# Patient Record
Sex: Female | Born: 1935 | Race: White | Hispanic: No | Smoking: Never smoker
Health system: Southern US, Community
[De-identification: ages and names within clinical notes are randomized; demographics above are authoritative.]

## PROBLEM LIST (undated history)

## (undated) DIAGNOSIS — M2550 Pain in unspecified joint: Secondary | ICD-10-CM

## (undated) DIAGNOSIS — C801 Malignant (primary) neoplasm, unspecified: Secondary | ICD-10-CM

## (undated) DIAGNOSIS — E785 Hyperlipidemia, unspecified: Secondary | ICD-10-CM

## (undated) DIAGNOSIS — M797 Fibromyalgia: Secondary | ICD-10-CM

## (undated) DIAGNOSIS — M5136 Other intervertebral disc degeneration, lumbar region: Secondary | ICD-10-CM

## (undated) DIAGNOSIS — Z8709 Personal history of other diseases of the respiratory system: Secondary | ICD-10-CM

## (undated) DIAGNOSIS — R351 Nocturia: Secondary | ICD-10-CM

## (undated) DIAGNOSIS — E669 Obesity, unspecified: Secondary | ICD-10-CM

## (undated) DIAGNOSIS — IMO0002 Reserved for concepts with insufficient information to code with codable children: Secondary | ICD-10-CM

## (undated) DIAGNOSIS — K2970 Gastritis, unspecified, without bleeding: Secondary | ICD-10-CM

## (undated) DIAGNOSIS — M069 Rheumatoid arthritis, unspecified: Secondary | ICD-10-CM

## (undated) DIAGNOSIS — M1990 Unspecified osteoarthritis, unspecified site: Secondary | ICD-10-CM

## (undated) DIAGNOSIS — Z860100 Personal history of colon polyps, unspecified: Secondary | ICD-10-CM

## (undated) DIAGNOSIS — R42 Dizziness and giddiness: Secondary | ICD-10-CM

## (undated) DIAGNOSIS — I10 Essential (primary) hypertension: Secondary | ICD-10-CM

## (undated) DIAGNOSIS — R350 Frequency of micturition: Secondary | ICD-10-CM

## (undated) DIAGNOSIS — R200 Anesthesia of skin: Secondary | ICD-10-CM

## (undated) DIAGNOSIS — M810 Age-related osteoporosis without current pathological fracture: Secondary | ICD-10-CM

## (undated) DIAGNOSIS — M549 Dorsalgia, unspecified: Secondary | ICD-10-CM

## (undated) DIAGNOSIS — I639 Cerebral infarction, unspecified: Secondary | ICD-10-CM

## (undated) DIAGNOSIS — K649 Unspecified hemorrhoids: Secondary | ICD-10-CM

## (undated) DIAGNOSIS — J189 Pneumonia, unspecified organism: Secondary | ICD-10-CM

## (undated) DIAGNOSIS — K219 Gastro-esophageal reflux disease without esophagitis: Secondary | ICD-10-CM

## (undated) DIAGNOSIS — I219 Acute myocardial infarction, unspecified: Secondary | ICD-10-CM

## (undated) DIAGNOSIS — K5790 Diverticulosis of intestine, part unspecified, without perforation or abscess without bleeding: Secondary | ICD-10-CM

## (undated) DIAGNOSIS — M51369 Other intervertebral disc degeneration, lumbar region without mention of lumbar back pain or lower extremity pain: Secondary | ICD-10-CM

## (undated) DIAGNOSIS — E119 Type 2 diabetes mellitus without complications: Secondary | ICD-10-CM

## (undated) HISTORY — PX: TUBAL LIGATION: SHX77

## (undated) HISTORY — DX: Malignant (primary) neoplasm, unspecified: C80.1

## (undated) HISTORY — DX: Type 2 diabetes mellitus without complications: E11.9

## (undated) HISTORY — DX: Hyperlipidemia, unspecified: E78.5

## (undated) HISTORY — DX: Acute myocardial infarction, unspecified: I21.9

## (undated) HISTORY — PX: APPENDECTOMY: SHX54

## (undated) HISTORY — DX: Essential (primary) hypertension: I10

## (undated) HISTORY — DX: Reserved for concepts with insufficient information to code with codable children: IMO0002

## (undated) HISTORY — DX: Unspecified osteoarthritis, unspecified site: M19.90

## (undated) HISTORY — DX: Other intervertebral disc degeneration, lumbar region without mention of lumbar back pain or lower extremity pain: M51.369

## (undated) HISTORY — DX: Obesity, unspecified: E66.9

## (undated) HISTORY — DX: Cerebral infarction, unspecified: I63.9

## (undated) HISTORY — PX: COLONOSCOPY: SHX174

## (undated) HISTORY — DX: Other intervertebral disc degeneration, lumbar region: M51.36

## (undated) HISTORY — DX: Rheumatoid arthritis, unspecified: M06.9

## (undated) HISTORY — DX: Gastro-esophageal reflux disease without esophagitis: K21.9

## (undated) HISTORY — DX: Fibromyalgia: M79.7

---

## 1999-02-04 ENCOUNTER — Ambulatory Visit (HOSPITAL_COMMUNITY): Admission: RE | Admit: 1999-02-04 | Discharge: 1999-02-04 | Payer: Self-pay

## 2003-07-02 ENCOUNTER — Encounter: Payer: Self-pay | Admitting: Family Medicine

## 2003-07-02 ENCOUNTER — Encounter: Admission: RE | Admit: 2003-07-02 | Discharge: 2003-07-02 | Payer: Self-pay | Admitting: Family Medicine

## 2004-07-18 ENCOUNTER — Encounter: Admission: RE | Admit: 2004-07-18 | Discharge: 2004-07-18 | Payer: Self-pay | Admitting: Family Medicine

## 2005-11-05 ENCOUNTER — Encounter: Admission: RE | Admit: 2005-11-05 | Discharge: 2005-11-05 | Payer: Self-pay | Admitting: Family Medicine

## 2007-10-01 ENCOUNTER — Encounter: Admission: RE | Admit: 2007-10-01 | Discharge: 2007-10-01 | Payer: Self-pay

## 2007-10-05 ENCOUNTER — Emergency Department (HOSPITAL_COMMUNITY): Admission: EM | Admit: 2007-10-05 | Discharge: 2007-10-05 | Payer: Self-pay | Admitting: Emergency Medicine

## 2007-11-04 ENCOUNTER — Encounter (INDEPENDENT_AMBULATORY_CARE_PROVIDER_SITE_OTHER): Payer: Self-pay | Admitting: Family Medicine

## 2007-11-04 ENCOUNTER — Ambulatory Visit: Payer: Self-pay | Admitting: *Deleted

## 2007-11-04 ENCOUNTER — Ambulatory Visit (HOSPITAL_COMMUNITY): Admission: RE | Admit: 2007-11-04 | Discharge: 2007-11-04 | Payer: Self-pay | Admitting: Family Medicine

## 2007-12-01 LAB — HM MAMMOGRAPHY

## 2008-08-04 ENCOUNTER — Encounter: Admission: RE | Admit: 2008-08-04 | Discharge: 2008-08-04 | Payer: Self-pay

## 2008-09-12 ENCOUNTER — Encounter: Admission: RE | Admit: 2008-09-12 | Discharge: 2008-10-08 | Payer: Self-pay | Admitting: Orthopedic Surgery

## 2008-09-14 ENCOUNTER — Other Ambulatory Visit: Admission: RE | Admit: 2008-09-14 | Discharge: 2008-09-14 | Payer: Self-pay | Admitting: Family Medicine

## 2010-09-26 ENCOUNTER — Encounter: Payer: Self-pay | Admitting: Family Medicine

## 2010-12-09 ENCOUNTER — Ambulatory Visit
Admission: RE | Admit: 2010-12-09 | Discharge: 2010-12-09 | Payer: Self-pay | Source: Home / Self Care | Attending: Family Medicine | Admitting: Family Medicine

## 2010-12-09 DIAGNOSIS — Z860100 Personal history of colon polyps, unspecified: Secondary | ICD-10-CM | POA: Insufficient documentation

## 2010-12-09 DIAGNOSIS — IMO0002 Reserved for concepts with insufficient information to code with codable children: Secondary | ICD-10-CM | POA: Insufficient documentation

## 2010-12-09 DIAGNOSIS — K5730 Diverticulosis of large intestine without perforation or abscess without bleeding: Secondary | ICD-10-CM | POA: Insufficient documentation

## 2010-12-09 DIAGNOSIS — J42 Unspecified chronic bronchitis: Secondary | ICD-10-CM | POA: Insufficient documentation

## 2010-12-09 DIAGNOSIS — E663 Overweight: Secondary | ICD-10-CM | POA: Insufficient documentation

## 2010-12-09 DIAGNOSIS — K219 Gastro-esophageal reflux disease without esophagitis: Secondary | ICD-10-CM | POA: Insufficient documentation

## 2010-12-09 DIAGNOSIS — H00019 Hordeolum externum unspecified eye, unspecified eyelid: Secondary | ICD-10-CM | POA: Insufficient documentation

## 2010-12-09 DIAGNOSIS — Z9189 Other specified personal risk factors, not elsewhere classified: Secondary | ICD-10-CM | POA: Insufficient documentation

## 2010-12-09 DIAGNOSIS — E782 Mixed hyperlipidemia: Secondary | ICD-10-CM | POA: Insufficient documentation

## 2010-12-09 DIAGNOSIS — Z8619 Personal history of other infectious and parasitic diseases: Secondary | ICD-10-CM | POA: Insufficient documentation

## 2010-12-09 DIAGNOSIS — M1990 Unspecified osteoarthritis, unspecified site: Secondary | ICD-10-CM | POA: Insufficient documentation

## 2010-12-09 DIAGNOSIS — M069 Rheumatoid arthritis, unspecified: Secondary | ICD-10-CM

## 2010-12-09 DIAGNOSIS — Z8719 Personal history of other diseases of the digestive system: Secondary | ICD-10-CM | POA: Insufficient documentation

## 2010-12-09 DIAGNOSIS — H259 Unspecified age-related cataract: Secondary | ICD-10-CM | POA: Insufficient documentation

## 2010-12-09 HISTORY — DX: Rheumatoid arthritis, unspecified: M06.9

## 2010-12-09 HISTORY — DX: Unspecified osteoarthritis, unspecified site: M19.90

## 2010-12-09 HISTORY — DX: Reserved for concepts with insufficient information to code with codable children: IMO0002
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
## 2011-01-02 ENCOUNTER — Encounter: Payer: Self-pay | Admitting: Family Medicine

## 2011-01-21 NOTE — Letter (Signed)
Summary: 2006-2011  2006-2011   Imported By: Lester [place] 01/13/2011 07:20:18  _____________________________________________________________________  External Attachment:    Type:   Image     Comment:   External Document

## 2011-02-09 ENCOUNTER — Encounter: Payer: Self-pay | Admitting: Family Medicine

## 2011-02-16 ENCOUNTER — Encounter: Payer: Self-pay | Admitting: Family Medicine

## 2011-02-18 ENCOUNTER — Encounter: Payer: Self-pay | Admitting: Family Medicine

## 2011-02-18 ENCOUNTER — Ambulatory Visit (INDEPENDENT_AMBULATORY_CARE_PROVIDER_SITE_OTHER): Payer: Medicare Other | Admitting: Family Medicine

## 2011-02-18 DIAGNOSIS — M069 Rheumatoid arthritis, unspecified: Secondary | ICD-10-CM

## 2011-02-18 DIAGNOSIS — K219 Gastro-esophageal reflux disease without esophagitis: Secondary | ICD-10-CM

## 2011-02-18 DIAGNOSIS — E782 Mixed hyperlipidemia: Secondary | ICD-10-CM

## 2011-02-18 DIAGNOSIS — E663 Overweight: Secondary | ICD-10-CM

## 2011-02-18 NOTE — Progress Notes (Signed)
  Subjective:    Patient ID: [name], female    DOB: 1935/03/26, 76 y.o.   MRN: [id number]  HPI Patient is a 76 yo Caucasian female in today for follow up on her new patient appt. She has recently been seen by her Rheumatologist and had injections in b/l shoulders for chronic pain and she has had good results. Her shoulders and back pain are tolerable at this time. Her biggest concern today is related to chronic dyspepsia and epigastric discomfort. She reports this is long standing but has recently worsened. She has had a colonoscopy in the past but never an upper endoscopy. She denies any H Pylori testing that she is aware of. No fevers/chills/constipation/diarrhea/bloody or tarry stool. She does experience some nausea/epigastric and periumbilical discomfort. Some substernal burning and some sour taste in the mouth is noted as well at times. She acknowledges that if she eats a better diet with less red meat, fatty or acidic and spicy foods she feels better. She has cut her consumption of coffee to just one cup per day and that has helped some. No CP/palp/SOB/GU c/o. She does note that raw veg/salad and red meat all tend to make her symptoms worse as well. The epigastric pain is somewhat worse when she lies down so she has been sleeping in her recliner to decrease her symptoms    Review of Systems  Constitutional: Negative for fever, chills, activity change, appetite change, fatigue and unexpected weight change.  HENT: Positive for sore throat. Negative for congestion, drooling, mouth sores, trouble swallowing, neck pain and postnasal drip.   Respiratory: Negative for apnea, cough, choking, chest tightness, shortness of breath and wheezing.   Cardiovascular: Negative for chest pain and palpitations.  Gastrointestinal: Negative for nausea, vomiting, abdominal pain, diarrhea, constipation and abdominal distention.  Musculoskeletal: Positive for back pain and arthralgias.  Psychiatric/Behavioral:  Negative for behavioral problems and agitation.       Objective:   Physical Exam  Constitutional: She is oriented to person, place, and time. She appears well-developed and well-nourished. No distress.  HENT:  Head: Normocephalic and atraumatic.  Eyes: Conjunctivae are normal.  Neck: Normal range of motion. Neck supple. No thyromegaly present.  Cardiovascular: Normal rate, regular rhythm and normal heart sounds.   No murmur heard. Pulmonary/Chest: Effort normal and breath sounds normal.  Abdominal: Soft. Bowel sounds are normal. She exhibits no mass. There is no tenderness. There is no rebound and no guarding.  Musculoskeletal: She exhibits no edema.  Lymphadenopathy:    She has no cervical adenopathy.  Neurological: She is alert and oriented to person, place, and time.  Skin: No rash noted. She is not diaphoretic.  Psychiatric: She has a normal mood and affect.          Assessment & Plan:

## 2011-02-18 NOTE — Assessment & Plan Note (Signed)
Continue ongoing dietary restrictions and exercise as tolerated. Even slight weight loss should help reflux.

## 2011-02-18 NOTE — Assessment & Plan Note (Signed)
Is following with Rheumatology and is actually doing relatively well in that regard, no change in therapy at this time but encouraged increased physical activity

## 2011-02-18 NOTE — Assessment & Plan Note (Signed)
Patient is struggling with worsening symptoms. She agrees to try Tums prn, limit diet and add Ginger caps tid, then she will return next week to have an H Pylori titer drawn next week and she will report worsening symptoms. Patient reports adverse reactions to various acid suppressing meds in the past but she cannot remember which ones so she is hesitant to start one at this time. She has never had a upper endoscopy and is hesitant to proceed with one due to bad outcomes she has heard about. She will consider referral to Dr Jason Fila her Gastroenterologist if she is not improving for possible endoscopy

## 2011-02-18 NOTE — Patient Instructions (Addendum)
Heartburn  Heartburn is a painful, burning sensation in the chest. It may feel worse in certain positions, such as lying down or bending over. It is caused by stomach acid backing up into the tube that carries food from the mouth down to the stomach (lower esophagus).  CAUSES A number of conditions can cause or worsen heartburn, including:   Pregnancy.   Being overweight (obesity).   A condition called hiatal hernia, in which part or all of the stomach is moved up into the chest through a weakness in the diaphragm muscle.   Alcohol.   Exercise.   Eating just before going to bed.   Overeating.   Medications, including:   Nonsteroidal anti-inflammatory drugs, such as ibuprofen and naproxen.   Aspirin.   Some blood pressure medicines, including beta-blockers, calcium channel blockers, and alpha-blockers.   Nitrates (used to treat angina).   The asthma medication Theophylline.   Certain sedative drugs.   Heartburn may be worse after eating certain foods. These heartburn-causing foods are different for different people, but may include:   Peppers.   Chocolate.   Coffee.   High-fat foods, including fried foods.   Spicy foods.   Garlic, onions.   Citrus fruits, including oranges, grapefruit, lemons and limes.   Food containing tomatoes or tomato products.   Mint.   Carbonated beverages.   Vinegar.  SYMPTOMS  Symptoms may last for a few minutes or a few hours, and can include:  Burning pain in the chest or lower throat.   Bitter taste in the mouth.   Coughing.  DIAGNOSIS If the usual treatments for heartburn do not improve your symptoms, then tests may be done to see if there is another condition present. Possible tests may include:  X-rays.   Endoscopy. This is when a tube with a light and a camera on the end is used to examine the esophagus and the stomach.   Blood, breath, or stool tests may be used to check for bacteria that cause ulcers.    TREATMENT There are a number of non-prescription medicines used to treat heartburn, including:  Antacids.   Acid reducers (also called H-2 blockers).   Proton-pump inhibitors.  HOME CARE INSTRUCTIONS  Raise the head of your bed by putting blocks under the legs.   Eat 2-3 hours before going to bed.   Stop smoking.   Try to reach and maintain a healthy weight.   Do not eat just a few very large meals. Instead, eat many smaller meals throughout the day.   Try to identify foods and beverages that make your symptoms worse, and avoid these.   Avoid tight clothing.   Do not exercise right after eating.  SEEK IMMEDIATE MEDICAL CARE IF YOU:  Have severe chest pain that goes down your arm, or into your jaw or neck.   Feel sweaty, dizzy, or lightheaded.   Are short of breath.   Throw up (vomit) blood.   Have difficulty or pain with swallowing.   Have bloody or black, tarry stools.   Have bouts of heartburn more than three times a week for more than two weeks.  Document Released: 04/04/2009 Document Re-Released: 02/10/2010 [facility] Patient Information 2011 [place], [place].  Try Ginger tea or Ginger caps three x daily with meals and at bedtime, avoid offending foods and continue avoiding offending foods Consider the DASH DIET  Needs labs in next 1-2 weeks, fasting, FLP, liver, renal, CBC, TSH, H Pylori serum

## 2011-02-18 NOTE — Assessment & Plan Note (Signed)
Patient has significantly changed her diet and is avoiding partially hydrogenated oils and saturated fats as much as possible. We will repeat a FLP next week to further assess. She reports bad reaction with myalgias to a statin in the past but she is unsure which one. She will try and identify the agent with her pharmacist.

## 2011-02-24 ENCOUNTER — Ambulatory Visit: Payer: Self-pay | Admitting: Family Medicine

## 2011-02-26 ENCOUNTER — Other Ambulatory Visit (INDEPENDENT_AMBULATORY_CARE_PROVIDER_SITE_OTHER): Payer: Medicare Other | Admitting: Family Medicine

## 2011-02-26 DIAGNOSIS — R1013 Epigastric pain: Secondary | ICD-10-CM

## 2011-02-26 DIAGNOSIS — E782 Mixed hyperlipidemia: Secondary | ICD-10-CM

## 2011-02-26 DIAGNOSIS — E663 Overweight: Secondary | ICD-10-CM

## 2011-02-26 DIAGNOSIS — E785 Hyperlipidemia, unspecified: Secondary | ICD-10-CM

## 2011-02-26 LAB — RENAL FUNCTION PANEL
Calcium: 9.3 mg/dL (ref 8.4–10.5)
Glucose, Bld: 83 mg/dL (ref 70–99)
Phosphorus: 3.4 mg/dL (ref 2.3–4.6)
Potassium: 4.4 mEq/L (ref 3.5–5.1)
Sodium: 143 mEq/L (ref 135–145)

## 2011-02-26 LAB — CBC WITH DIFFERENTIAL/PLATELET
Eosinophils Absolute: 0.1 10*3/uL (ref 0.0–0.7)
Eosinophils Relative: 3.5 % (ref 0.0–5.0)
MCV: 97.6 fl (ref 78.0–100.0)
Monocytes Absolute: 0.4 10*3/uL (ref 0.1–1.0)
Neutrophils Relative %: 38.4 % — ABNORMAL LOW (ref 43.0–77.0)
Platelets: 206 10*3/uL (ref 150.0–400.0)
WBC: 3.8 10*3/uL — ABNORMAL LOW (ref 4.5–10.5)

## 2011-02-26 LAB — H. PYLORI ANTIBODY, IGG: H Pylori IgG: NEGATIVE

## 2011-02-26 LAB — LIPID PANEL
HDL: 58.1 mg/dL (ref >39.00)
Total CHOL/HDL Ratio: 4
VLDL: 20.4 mg/dL (ref 0.0–40.0)

## 2011-02-26 LAB — HEPATIC FUNCTION PANEL
AST: 30 U/L (ref 0–37)
Alkaline Phosphatase: 38 U/L — ABNORMAL LOW (ref 39–117)
Bilirubin, Direct: 0.2 mg/dL (ref 0.0–0.3)
Total Bilirubin: 0.6 mg/dL (ref 0.3–1.2)

## 2011-02-26 LAB — TSH: TSH: 2.02 u[IU]/mL (ref 0.35–5.50)

## 2011-02-26 LAB — LDL CHOLESTEROL, DIRECT: Direct LDL: 173.8 mg/dL

## 2011-02-26 NOTE — Progress Notes (Deleted)
[name] 02/26/2011      Progress Note-Follow Up  Subjective Subjective   Past Medical History  Diagnosis Date  . Hyperlipidemia   . Overweight 12/09/2010  . CATARACT, SENILE, BILATERAL 12/09/2010  . BRONCHITIS, RECURRENT 12/09/2010  . GERD 12/09/2010  . DIVERTICULAR DISEASE 12/09/2010  . Rheumatoid arthritis 12/09/2010  . LOC OSTEOARTHROS NOT SPEC PRIM/SEC UNSPEC SITE 12/09/2010  . DEGENERATIVE DISC DISEASE, AXIAL SKELETON 12/09/2010  . MEASLES, HX OF 12/09/2010  . COLONIC POLYPS, HX OF 12/09/2010    Past Surgical History  Procedure Date  . Appendectomy 1953  . Cesarean section 1965    RH neg  . Polypectomy 2009    Family History  Problem Relation Age of Onset  . Alcohol abuse Mother   . Asthma Mother   . Obesity Mother   . Aneurysm Father     abdominal aortic  . Heart attack Sister   . Obesity Sister   . Hypertension Sister   . Cancer Brother     abdominal, bone  . Colon polyps Daughter   . Heart disease Paternal Grandfather   . Cancer Sister     skin, recurrent breast cancer s/p mastectomy, declined other  treatment  . Obesity Sister   . Hypertension Sister   . Stroke Brother     hemorrhagic  . Hypertension Brother   . Diabetes Brother   . Arthritis Brother   . Hypertension Brother   . Arthritis Daughter   . Hyperlipidemia Daughter   . Obesity Daughter   . Colon polyps Daughter     precancerous  . Endometriosis Daughter     History   Social History  . Marital Status: Widowed    Spouse Name: N/A    Number of Children: N/A  . Years of Education: N/A   Occupational History  . Not on file.   Social History Main Topics  . Smoking status: Never Smoker   . Smokeless tobacco: Never Used  . Alcohol Use: No  . Drug Use: No  . Sexually Active: No   Other Topics Concern  . Not on file   Social History Narrative  . No narrative on file    Current Outpatient Prescriptions on File Prior to Visit  Medication Sig Dispense Refill  . ascorbic acid  (VITAMIN C) 500 MG tablet Take 500 mg by mouth 3 (three) times daily. Vitamin E       . Calcium Carbonate (CALCIUM 600 PO) Take by mouth daily.        . Calcium-Magnesium 350-233.33 MG TABS Take by mouth 3 (three) times daily.        . Cholecalciferol (VITAMIN D3) 1000 UNITS capsule Take 1,000 Units by mouth 2 (two) times daily. Take 2       . Coenzyme Q10 (COQ10) 100 MG capsule Take 100 mg by mouth daily.        . Digestive Enzymes (MULTI-ENZYME PO) Take by mouth 2 (two) times daily.        . fish oil-omega-3 fatty acids 1000 MG capsule Take 2 g by mouth 3 (three) times daily.        . Multiple Vitamin (MULTIVITAMIN) tablet Take 1 tablet by mouth daily.        . NON FORMULARY 900 mg 2 (two) times daily. Colest take 2 bid       . tobramycin (TOBREX) 0.3 % ophthalmic solution Place 2 drops into both eyes 4 (four) times daily.  Allergies  Allergen Reactions  . Codeine   . Esomeprazole Magnesium    ROS   Objective Objective  There were no vitals taken for this visit.  Physical Exam  Constitutional: She is well-developed, well-nourished, and in no distress. No distress.  HENT:  Left Ear: External ear normal.  Mouth/Throat: No oropharyngeal exudate.  Eyes: EOM are normal. Left eye exhibits no discharge. No scleral icterus.  Neck: No JVD present. Tracheal deviation present.  Cardiovascular: Normal heart sounds and intact distal pulses.   Pulmonary/Chest: She is in respiratory distress. She has no rales.  Abdominal: She exhibits no distension and no mass. There is tenderness. There is guarding.  Musculoskeletal: She exhibits edema. She exhibits no tenderness.  Lymphadenopathy:    She has no cervical adenopathy.  Skin: No rash noted. No erythema.     No results found for this basename: TSH   No results found for this basename: WBC, HGB, HCT, MCV, PLT   No results found for this basename: CREATININE, BUN, NA, K, CL, CO2   No results found for this basename: ALT, AST,  GGT, ALKPHOS, BILITOT   No results found for this basename: CHOL   No results found for this basename: HDL   No results found for this basename: LDLCALC   No results found for this basename: TRIG   No results found for this basename: CHOLHDL     Assessment & Plan  No problem-specific assessment & plan notes found for this encounter.

## 2011-02-26 NOTE — Progress Notes (Deleted)
  Subjective:    Patient ID: [name], female    DOB: 07-19-1935, 76 y.o.   MRN: [id number]  HPI    Review of Systems  Constitutional: Negative for fever and appetite change.  HENT: Negative for congestion and neck stiffness.   Eyes: Negative for visual disturbance.  Respiratory: Negative for shortness of breath.   Cardiovascular: Negative for chest pain, palpitations and leg swelling.  Gastrointestinal: Negative for nausea, vomiting, abdominal pain, diarrhea, constipation and abdominal distention.  Genitourinary: Negative for dysuria, urgency and frequency.  Musculoskeletal: Negative for myalgias and back pain.  Skin: Negative for rash.  Neurological: Negative for syncope and headaches.  Psychiatric/Behavioral: Negative for suicidal ideas, behavioral problems, self-injury, decreased concentration and agitation.       Objective:   Physical Exam  Constitutional: She is oriented to person, place, and time. She appears well-developed and well-nourished. No distress.  HENT:  Head: Normocephalic and atraumatic.  Eyes: Conjunctivae are normal.  Neck: Neck supple. No thyromegaly present.  Cardiovascular: Normal rate, regular rhythm and normal heart sounds.   No murmur heard. Pulmonary/Chest: Effort normal and breath sounds normal. No respiratory distress.  Abdominal: Soft. Bowel sounds are normal. She exhibits no distension and no mass. There is no tenderness.  Musculoskeletal: She exhibits no edema.  Lymphadenopathy:    She has no cervical adenopathy.  Neurological: She is alert and oriented to person, place, and time.  Skin: Skin is warm and dry.  Psychiatric: She has a normal mood and affect. Her behavior is normal.          Assessment & Plan:

## 2011-02-26 NOTE — Progress Notes (Deleted)
  Subjective:    Patient ID: [name], female    DOB: 1935-08-10, 76 y.o.   MRN: [id number]  HPI    Review of Systems  All other systems reviewed and are negative.       Objective:   Physical Exam  Constitutional: She is oriented to person, place, and time. She appears well-developed and well-nourished. No distress.  HENT:  Head: Normocephalic and atraumatic.  Eyes: Conjunctivae are normal.  Neck: Neck supple. No thyromegaly present.  Cardiovascular: Normal rate, regular rhythm and normal heart sounds.   No murmur heard. Pulmonary/Chest: Effort normal and breath sounds normal. No respiratory distress.  Abdominal: Soft. Bowel sounds are normal. She exhibits no distension and no mass. There is no tenderness.  Musculoskeletal: She exhibits no edema.  Lymphadenopathy:    She has no cervical adenopathy.  Neurological: She is alert and oriented to person, place, and time.  Skin: Skin is warm and dry.  Psychiatric: She has a normal mood and affect. Her behavior is normal.          Assessment & Plan:

## 2011-03-05 ENCOUNTER — Telehealth: Payer: Self-pay | Admitting: Family Medicine

## 2011-03-05 NOTE — Telephone Encounter (Signed)
Mailed to pt per pt request.

## 2011-03-05 NOTE — Telephone Encounter (Signed)
Patient is requesting a printed copy of her lab result, call patient when ready

## 2011-05-22 ENCOUNTER — Ambulatory Visit (INDEPENDENT_AMBULATORY_CARE_PROVIDER_SITE_OTHER): Payer: Medicare Other | Admitting: Family Medicine

## 2011-05-22 ENCOUNTER — Encounter: Payer: Self-pay | Admitting: Family Medicine

## 2011-05-22 DIAGNOSIS — Z889 Allergy status to unspecified drugs, medicaments and biological substances status: Secondary | ICD-10-CM | POA: Insufficient documentation

## 2011-05-22 DIAGNOSIS — J40 Bronchitis, not specified as acute or chronic: Secondary | ICD-10-CM

## 2011-05-22 DIAGNOSIS — J42 Unspecified chronic bronchitis: Secondary | ICD-10-CM

## 2011-05-22 DIAGNOSIS — K219 Gastro-esophageal reflux disease without esophagitis: Secondary | ICD-10-CM

## 2011-05-22 DIAGNOSIS — Z9109 Other allergy status, other than to drugs and biological substances: Secondary | ICD-10-CM

## 2011-05-22 MED ORDER — ALBUTEROL SULFATE HFA 108 (90 BASE) MCG/ACT IN AERS: 2.0000 | INHALATION_SPRAY | Freq: Four times a day (QID) | RESPIRATORY_TRACT | Status: DC | PRN

## 2011-05-22 MED ORDER — LORATADINE 10 MG PO TABS: 10.0000 mg | ORAL_TABLET | Freq: Every day | ORAL | Status: DC

## 2011-05-22 MED ORDER — SALINE 0.65 % NA SOLN: 2.0000 | Freq: Two times a day (BID) | NASAL | Status: DC | PRN

## 2011-05-22 MED ORDER — AZITHROMYCIN 250 MG PO TABS: ORAL_TABLET | ORAL | Status: AC

## 2011-05-22 NOTE — Assessment & Plan Note (Signed)
Patient has been struggling with increased shortness of breath and cough for about 2 weeks. 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 She does note over the years she's had worsening difficulty with shortness of breath cough. Today we'll treat an acute bronchitis with azithromycin. We'll also give her albuterol to use with instructions when necessary for shortness of breath, wheezing or cough. For the allergic component we will have her start Claritin and nasal saline daily and reassess when she returns from trip to visit family in [place]. She agrees to have a chest x-ray done before she leaves and to notify us if symptoms worsen. Upon returning she may benefit from further workup if her symptoms are not improving.

## 2011-05-22 NOTE — Assessment & Plan Note (Signed)
Patient notes this is significantly better with dietary changes and probiotics. At this point is very little heartburn no abdominal pain

## 2011-05-22 NOTE — Patient Instructions (Signed)
Bronchitis Bronchitis is the body's way of reacting to injury and/or infection (inflammation) of the bronchi. Bronchi are the air tubes that extend from the windpipe into the lungs. If the inflammation becomes severe, it may cause shortness of breath.  CAUSES Inflammation may be caused by:  A virus.   Germs (bacteria).   Dust.   Allergens.   Pollutants and many other irritants.  The cells lining the bronchial tree are covered with tiny hairs (cilia). These constantly beat upward, away from the lungs, toward the mouth. This keeps the lungs free of pollutants. When these cells become too irritated and are unable to do their job, mucus begins to develop. This causes the characteristic cough of bronchitis. The cough clears the lungs when the cilia are unable to do their job. Without either of these protective mechanisms, the mucus would settle in the lungs. Then you would develop pneumonia. Smoking is a common cause of bronchitis and can contribute to pneumonia. Stopping this habit is the single most important thing you can do to help yourself. TREATMENT  Your caregiver may prescribe an antibiotic if the cough is caused by bacteria. Also, medicines that open up your airways make it easier to breathe. Your caregiver may also recommend or prescribe an expectorant. It will loosen the mucus to be coughed up. Only take over-the-counter or prescription medicines for pain, discomfort, or fever as directed by your caregiver.   Removing whatever causes the problem (smoking, for example) is critical to preventing the problem from getting worse.   Cough suppressants may be prescribed for relief of cough symptoms.   Inhaled medicines may be prescribed to help with symptoms now and to help prevent problems from returning.   For those with recurrent (chronic) bronchitis, there may be a need for steroid medicines.  SEEK IMMEDIATE MEDICAL CARE IF:  During treatment, you develop more pus-like mucus  (purulent sputum).   You or your child has an oral temperature above 104, not controlled by medicine.   Your baby is older than 3 months with a rectal temperature of 102 F (38.9 C) or higher.   Your baby is 14 months old or younger with a rectal temperature of 100.4 F (38 C) or higher.   You become progressively more ill.   You have increased difficulty breathing, wheezing, or shortness of breath.  It is necessary to seek immediate medical care if you are elderly or sick from any other disease. MAKE SURE YOU:  Understand these instructions.   Will watch your condition.   Will get help right away if you are not doing well or get worse.  Document Released: 11/16/2005 Document Re-Released: 02/10/2010 [facility] Patient Information 2011 [place], [place].

## 2011-05-22 NOTE — Progress Notes (Signed)
[name] [id number] Aug 16, 1935 05/22/2011      Progress Note-Follow Up  Subjective  Chief Complaint  Chief Complaint  Patient presents with  . Shortness of Breath    and feels like she's off balance when going outside    HPI  Patient is a 75 year old Caucasian female who is in today with 2 weeks worth of worsening shortness of breath. She reports she takes a walk each day and has a small incline on the initial part of the walk. It always makes her somewhat short of breath which she recovers quickly and is able to complete her walk. Over the last 1-2 weeks the symptoms are worsening. She becomes short of breath faster and it sticks around longer she is having some coughing associated with it, some tightness in her throat. She denies any wheezing, fevers, chills, chest pain, palpitations, ear pain or sensation of presyncope but she does note occasionally feeling somewhat lightheaded and having clear rhinorrhea at times. She relates a long history of allergies dating back to childhood with watery itchy eyes and nose at times. She has an ongoing difficulty with being unable to breathe out of the left side of her nose even since childhood that is not worsening. 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 She is concerned that this has affected her breathing. She has not tried any over-the-counter medications for her symptoms  Past Medical History  Diagnosis Date  . Hyperlipidemia   . Overweight 12/09/2010  . CATARACT, SENILE, BILATERAL 12/09/2010  . BRONCHITIS, RECURRENT 12/09/2010  . GERD 12/09/2010  . DIVERTICULAR DISEASE 12/09/2010  . Rheumatoid arthritis 12/09/2010  . LOC OSTEOARTHROS NOT SPEC PRIM/SEC UNSPEC SITE 12/09/2010  . DEGENERATIVE DISC DISEASE, AXIAL SKELETON 12/09/2010  . MEASLES, HX OF 12/09/2010  . COLONIC POLYPS, HX OF 12/09/2010  . Multiple allergies 05/22/2011    Past Surgical History    Procedure Date  . Appendectomy 1953  . Cesarean section 1965    RH neg  . Polypectomy 2009    Family History  Problem Relation Age of Onset  . Alcohol abuse Mother   . Asthma Mother   . Obesity Mother   . Aneurysm Father     abdominal aortic  . Heart attack Sister   . Obesity Sister   . Hypertension Sister   . Cancer Brother     abdominal, bone  . Colon polyps Daughter   . Heart disease Paternal Grandfather   . Cancer Sister     skin, recurrent breast cancer s/p mastectomy, declined other  treatment  . Obesity Sister   . Hypertension Sister   . Stroke Brother     hemorrhagic  . Hypertension Brother   . Diabetes Brother   . Arthritis Brother   . Hypertension Brother   . Arthritis Daughter   . Hyperlipidemia Daughter   . Obesity Daughter   . Colon polyps Daughter     precancerous  . Endometriosis Daughter     History   Social History  . Marital Status: Widowed    Spouse Name: N/A    Number of Children: N/A  . Years of Education: N/A   Occupational History  . Not on file.   Social History Main Topics  . Smoking status: Never Smoker   . Smokeless tobacco: Never Used  . Alcohol Use: No  . Drug Use: No  . Sexually Active: No  Other Topics Concern  . Not on file   Social History Narrative  . No narrative on file    Current Outpatient Prescriptions on File Prior to Visit  Medication Sig Dispense Refill  . Calcium Carbonate (CALCIUM 600 PO) Take by mouth daily.        . Coenzyme Q10 (COQ10) 100 MG capsule Take 100 mg by mouth daily.        . Digestive Enzymes (MULTI-ENZYME PO) Take by mouth 2 (two) times daily.        . fish oil-omega-3 fatty acids 1000 MG capsule Take 2 g by mouth 3 (three) times daily.        . NON FORMULARY 900 mg 2 (two) times daily. Colest take 2 bid       . tobramycin (TOBREX) 0.3 % ophthalmic solution Place 2 drops into both eyes 4 (four) times daily.        Marland Kitchen ascorbic acid (VITAMIN C) 500 MG tablet Take 500 mg by mouth 3  (three) times daily. Vitamin E       . Calcium-Magnesium 350-233.33 MG TABS Take by mouth 3 (three) times daily.        . Cholecalciferol (VITAMIN D3) 1000 UNITS capsule Take 1,000 Units by mouth 2 (two) times daily. Take 2       . Multiple Vitamin (MULTIVITAMIN) tablet Take 1 tablet by mouth daily.          Allergies  Allergen Reactions  . Codeine   . Esomeprazole Magnesium     Review of Systems  Review of Systems  Constitutional: Negative for fever and malaise/fatigue.  HENT: Positive for congestion. Negative for ear pain and ear discharge.   Eyes: Negative for discharge.  Respiratory: Positive for cough, sputum production and shortness of breath. Negative for wheezing.   Cardiovascular: Negative for chest pain, palpitations, orthopnea, leg swelling and PND.  Gastrointestinal: Negative for nausea, abdominal pain and diarrhea.  Genitourinary: Negative for dysuria.  Musculoskeletal: Negative for falls.  Skin: Negative for rash.  Neurological: Negative for loss of consciousness and headaches.  Endo/Heme/Allergies: Negative for polydipsia.  Psychiatric/Behavioral: Negative for depression and suicidal ideas. The patient is not nervous/anxious and does not have insomnia.     Objective  BP 138/78  Pulse 75  Temp(Src) 98.1 F (36.7 C) (Oral)  Ht 5' 2.75" (1.594 m)  Wt 160 lb 12.8 oz (72.938 kg)  BMI 28.71 kg/m2  SpO2 95%  Physical Exam  Physical Exam  Constitutional: She is oriented to person, place, and time and well-developed, well-nourished, and in no distress. No distress.  HENT:  Head: Normocephalic and atraumatic.  Eyes: Conjunctivae are normal.  Neck: Neck supple. No thyromegaly present.  Cardiovascular: Normal rate, regular rhythm and normal heart sounds.   No murmur heard. Pulmonary/Chest: Effort normal. She has no wheezes.       Slightly decreased BS RLL  Abdominal: She exhibits no distension and no mass.  Musculoskeletal: She exhibits no edema.    Lymphadenopathy:    She has no cervical adenopathy.  Neurological: She is alert and oriented to person, place, and time.  Skin: Skin is warm and dry. No rash noted. She is not diaphoretic.  Psychiatric: Memory, affect and judgment normal.    Lab Results  Component Value Date   TSH 2.02 02/26/2011   Lab Results  Component Value Date   WBC 3.8* 02/26/2011   HGB 13.5 02/26/2011   HCT 39.8 02/26/2011   MCV 97.6 02/26/2011   PLT 206.0  02/26/2011   Lab Results  Component Value Date   CREATININE 1.0 02/26/2011   BUN 21 02/26/2011   NA 143 02/26/2011   K 4.4 02/26/2011   CL 107 02/26/2011   CO2 29 02/26/2011   Lab Results  Component Value Date   ALT 23 02/26/2011   AST 30 02/26/2011   ALKPHOS 38* 02/26/2011   BILITOT 0.6 02/26/2011   Lab Results  Component Value Date   CHOL 248* 02/26/2011   Lab Results  Component Value Date   HDL 58.10 02/26/2011   No results found for this basename: [facility]   Lab Results  Component Value Date   TRIG 102.0 02/26/2011   Lab Results  Component Value Date   CHOLHDL 4 02/26/2011     Assessment & Plan  GERD Patient notes this is significantly better with dietary changes and probiotics. At this point is very little heartburn no abdominal pain  BRONCHITIS, RECURRENT Patient has been struggling with increased shortness of breath and cough for about 2 weeks. 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 She does note over the years she's had worsening difficulty with shortness of breath cough. Today we'll treat an acute bronchitis with azithromycin. We'll also give her albuterol to use with instructions when necessary for shortness of breath, wheezing or cough. For the allergic component we will have her start Claritin and nasal saline daily and reassess when she returns from trip to visit family in [place]. She agrees to have a chest x-ray done before she leaves  and to notify us if symptoms worsen. Upon returning she may benefit from further workup if her symptoms are not improving.  Multiple allergies Patient notes itchy watery eyes and nose. Long history of intermittent allergic symptoms. She is asked to take the ranitidine 10 mg daily and use saline nasal spray twice a day for the next month to see if we can control her symptoms better.

## 2011-05-22 NOTE — Assessment & Plan Note (Signed)
Patient notes itchy watery eyes and nose. Long history of intermittent allergic symptoms. She is asked to take the ranitidine 10 mg daily and use saline nasal spray twice a day for the next month to see if we can control her symptoms better.

## 2011-05-25 ENCOUNTER — Ambulatory Visit
Admission: RE | Admit: 2011-05-25 | Discharge: 2011-05-25 | Disposition: A | Discharge status: Home / Self Care | Payer: Medicare Other | Source: Ambulatory Visit | Attending: Family Medicine | Admitting: Family Medicine

## 2011-05-25 DIAGNOSIS — J40 Bronchitis, not specified as acute or chronic: Secondary | ICD-10-CM

## 2011-07-16 ENCOUNTER — Ambulatory Visit (INDEPENDENT_AMBULATORY_CARE_PROVIDER_SITE_OTHER): Payer: Medicare Other | Admitting: Family Medicine

## 2011-07-16 ENCOUNTER — Encounter: Payer: Self-pay | Admitting: Family Medicine

## 2011-07-16 VITALS — BP 130/72 | HR 80 | Temp 98.6°F | Ht 62.75 in | Wt 166.0 lb

## 2011-07-16 DIAGNOSIS — J029 Acute pharyngitis, unspecified: Secondary | ICD-10-CM

## 2011-07-16 LAB — POCT RAPID STREP A (OFFICE): Rapid Strep A Screen: NEGATIVE

## 2011-07-16 NOTE — Progress Notes (Signed)
Addended by: Baldemar Lenis R on: 07/16/2011 02:21 PM   Modules accepted: Orders

## 2011-07-16 NOTE — Assessment & Plan Note (Signed)
Viral etiology suspected.  Throat culture sent to lab. Discussed symptomatic care, rest, fluids. Warning signs of worsening illness discussed.  Call or return for problems.

## 2011-07-16 NOTE — Progress Notes (Signed)
OFFICE NOTE  07/16/2011  CC:  Chief Complaint  Patient presents with  . Sore Throat    2-3 days, recently treated for bronchitis in [place]     HPI:   Patient is a 76 y.o. Caucasian female who is here for sore throat. Onset about 3d/a.  Constant, moderate severity, all over throat.  No n/v, no fever, no sneezing or runny nose/congestion.  No HA.  She has mild fatigue and increased achiness over his baseline. Has a mild, slowly resolving cough from when she had acute bronchitis about 61mo ago in [place].  She took a course of zithromax for this and feels much improved.  She does not smoke.  No sick contacts.  Pertinent PMH:  GERD Recurrent bronchitis Overweight RA-no meds at this time MEDS;   Outpatient Prescriptions Prior to Visit  Medication Sig Dispense Refill  . albuterol (PROVENTIL HFA;VENTOLIN HFA) 108 (90 BASE) MCG/ACT inhaler Inhale 2 puffs into the lungs every 6 (six) hours as needed for wheezing.  1 Inhaler  0  . ascorbic acid (VITAMIN C) 500 MG tablet Take 500 mg by mouth 3 (three) times daily. Vitamin E       . Calcium Carbonate (CALCIUM 600 PO) Take by mouth daily.        . Cholecalciferol (VITAMIN D3) 1000 UNITS capsule Take 1,000 Units by mouth 2 (two) times daily.       . fish oil-omega-3 fatty acids 1000 MG capsule Take 1 g by mouth 3 (three) times daily.       . NON FORMULARY Colestol.  Take 2 tablets by mouth twice daily.      . Nutritional Supplements (JUICE PLUS FIBRE PO) Take 4 tablets by mouth daily.        . Saline 0.65 % (SOLN) SOLN Place 2 sprays into the nose 2 (two) times daily as needed.  1 Bottle  0  . Coenzyme Q10 (COQ10) 100 MG capsule Take 100 mg by mouth daily.        Marland Kitchen loratadine (CLARITIN) 10 MG tablet Take 1 tablet (10 mg total) by mouth daily.  30 tablet  2  . Calcium-Magnesium 350-233.33 MG TABS Take by mouth 3 (three) times daily.        . Digestive Enzymes (MULTI-ENZYME PO) Take by mouth 2 (two) times daily.        . Multiple Vitamin (MULTIVITAMIN)  tablet Take 1 tablet by mouth daily.        Marland Kitchen tobramycin (TOBREX) 0.3 % ophthalmic solution Place 2 drops into both eyes 4 (four) times daily.          PE: Blood pressure 130/72, pulse 80, temperature 98.6 F (37 C), temperature source Oral, height 5' 2.75" (1.594 m), weight 166 lb (75.297 kg), SpO2 97.00%. VS: noted--normal. Gen: alert, NAD, NONTOXIC APPEARING. HEENT: eyes without injection, drainage, or swelling.  Ears: EACs clear, TMs with normal light reflex and landmarks.  Nose: Clear rhinorrhea, with some dried, crusty exudate adherent to mildly injected mucosa.  No purulent d/c.  No paranasal sinus TTP.  No facial swelling.  Throat and mouth without focal lesion.  No pharyngial swelling, erythema, or exudate.   Neck: supple, no LAD.   LUNGS: CTA bilat, nonlabored resps.   CV: RRR, no m/r/g. EXT: no c/c/e SKIN: no rash  Lab: rapid strep negative  IMPRESSION AND PLAN:  Pharyngitis, acute Viral etiology suspected.  Throat culture sent to lab. Discussed symptomatic care, rest, fluids. Warning signs of worsening illness discussed.  Call or return for problems.     FOLLOW UP:  Return if symptoms worsen or fail to improve.

## 2011-07-20 ENCOUNTER — Ambulatory Visit: Payer: Medicare Other | Admitting: Family Medicine

## 2011-07-22 ENCOUNTER — Ambulatory Visit: Payer: Medicare Other | Admitting: Family Medicine

## 2011-08-31 ENCOUNTER — Encounter: Payer: Self-pay | Admitting: Family Medicine

## 2011-08-31 ENCOUNTER — Ambulatory Visit (INDEPENDENT_AMBULATORY_CARE_PROVIDER_SITE_OTHER): Payer: Medicare Other | Admitting: Family Medicine

## 2011-08-31 VITALS — BP 136/84 | HR 66 | Temp 97.6°F | Ht 62.75 in | Wt 166.4 lb

## 2011-08-31 DIAGNOSIS — Z9109 Other allergy status, other than to drugs and biological substances: Secondary | ICD-10-CM

## 2011-08-31 DIAGNOSIS — R5381 Other malaise: Secondary | ICD-10-CM

## 2011-08-31 DIAGNOSIS — G5600 Carpal tunnel syndrome, unspecified upper limb: Secondary | ICD-10-CM | POA: Insufficient documentation

## 2011-08-31 DIAGNOSIS — R5383 Other fatigue: Secondary | ICD-10-CM

## 2011-08-31 DIAGNOSIS — K219 Gastro-esophageal reflux disease without esophagitis: Secondary | ICD-10-CM

## 2011-08-31 DIAGNOSIS — K5730 Diverticulosis of large intestine without perforation or abscess without bleeding: Secondary | ICD-10-CM

## 2011-08-31 DIAGNOSIS — Z23 Encounter for immunization: Secondary | ICD-10-CM

## 2011-08-31 DIAGNOSIS — M069 Rheumatoid arthritis, unspecified: Secondary | ICD-10-CM

## 2011-08-31 DIAGNOSIS — Z889 Allergy status to unspecified drugs, medicaments and biological substances status: Secondary | ICD-10-CM

## 2011-08-31 DIAGNOSIS — E782 Mixed hyperlipidemia: Secondary | ICD-10-CM

## 2011-08-31 DIAGNOSIS — E785 Hyperlipidemia, unspecified: Secondary | ICD-10-CM

## 2011-08-31 LAB — LDL CHOLESTEROL, DIRECT: Direct LDL: 175.7 mg/dL

## 2011-08-31 LAB — CBC WITH DIFFERENTIAL/PLATELET
Eosinophils Absolute: 0.4 10*3/uL (ref 0.0–0.7)
MCHC: 33.3 g/dL (ref 30.0–36.0)
MCV: 97.7 fl (ref 78.0–100.0)
Monocytes Absolute: 0.4 10*3/uL (ref 0.1–1.0)
Neutrophils Relative %: 41.4 % — ABNORMAL LOW (ref 43.0–77.0)
Platelets: 239 10*3/uL (ref 150.0–400.0)
RDW: 14.3 % (ref 11.5–14.6)

## 2011-08-31 LAB — HEPATIC FUNCTION PANEL
AST: 32 U/L (ref 0–37)
Albumin: 4.3 g/dL (ref 3.5–5.2)
Alkaline Phosphatase: 39 U/L (ref 39–117)
Total Bilirubin: 0.6 mg/dL (ref 0.3–1.2)

## 2011-08-31 LAB — RENAL FUNCTION PANEL
Glucose, Bld: 90 mg/dL (ref 70–99)
Phosphorus: 3.5 mg/dL (ref 2.3–4.6)
Potassium: 4.5 mEq/L (ref 3.5–5.1)
Sodium: 142 mEq/L (ref 135–145)

## 2011-08-31 LAB — LIPID PANEL
HDL: 54.5 mg/dL (ref >39.00)
Triglycerides: 102 mg/dL (ref 0.0–149.0)
VLDL: 20.4 mg/dL (ref 0.0–40.0)

## 2011-08-31 NOTE — Assessment & Plan Note (Addendum)
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

## 2011-08-31 NOTE — Assessment & Plan Note (Addendum)
Has a brace she uses intermittently, her symptoms are improving. Some numbness in fingers comes and goes. Encouraged ice and  aspercreme at least bid

## 2011-08-31 NOTE — Patient Instructions (Signed)

## 2011-09-08 ENCOUNTER — Telehealth: Payer: Self-pay | Admitting: Family Medicine

## 2011-09-08 LAB — URINALYSIS, ROUTINE W REFLEX MICROSCOPIC
Bilirubin Urine: NEGATIVE
Glucose, UA: NEGATIVE
Ketones, ur: 40 — AB
Leukocytes, UA: NEGATIVE
Nitrite: NEGATIVE
Protein, ur: 30 — AB
pH: 6

## 2011-09-08 LAB — BASIC METABOLIC PANEL
BUN: 17
CO2: 27
Glucose, Bld: 92
Potassium: 4.1
Sodium: 133 — ABNORMAL LOW

## 2011-09-08 LAB — CBC
MCHC: 35
Platelets: 293
RBC: 4.26
WBC: 9.9

## 2011-09-08 LAB — HEPATIC FUNCTION PANEL
ALT: 21
Albumin: 3.9
Alkaline Phosphatase: 58
Total Protein: 7.4

## 2011-09-08 LAB — LIPASE, BLOOD: Lipase: 17

## 2011-09-08 NOTE — Telephone Encounter (Signed)
Informed patient of lab results and mailed copy of labs to patient

## 2011-09-08 NOTE — Telephone Encounter (Signed)
Patient would like to know recent lab results, she will be in a meeting noon-2:30 so she won't be able to answer her phone during that time, she would prefer someone to contact her when she can talk

## 2011-09-08 NOTE — Assessment & Plan Note (Signed)
Avoid trans fats, increase exercise, add fiber supplement and continue fish oil caps daily

## 2011-09-08 NOTE — Assessment & Plan Note (Signed)
No difficulties, encouraged hi fiber diet and good hydration

## 2011-09-08 NOTE — Assessment & Plan Note (Signed)
Improved with dietary changes no need for medications at this time

## 2011-09-08 NOTE — Telephone Encounter (Signed)
Left message with daughter Orlie Pollen will call pt at 2:30

## 2011-09-08 NOTE — Assessment & Plan Note (Signed)
No symptoms at present, may use Loratatidine prn

## 2011-09-08 NOTE — Progress Notes (Signed)
[name] [id number] Aug 28, 1935 09/08/2011      Progress Note New Patient  Subjective  Chief Complaint  Chief Complaint  Patient presents with  . Annual Exam    Physical    HPI  She is a 76 year old Caucasian female who is in today for followup. Continues to stroke with rheumatologic pain and shoulder pain. His recent steroid injections in her shoulders which he thinks is marginally helpful. She's also had a flare of some wrist pain and has been diagnosed with an ulcer in her left wrist. Is using her splint intermittently but finds it pinches and can increase her symptoms at times and no recent illness, fevers, chills, chest pain, palpitations, shortness of breath, GI or GU complaints at today's visit her reflexes improved with dietary changes and she has not required medications. Her allergies are presently well-controlled he is agreeing to a flu shot today  Past Medical History  Diagnosis Date  . Hyperlipidemia   . Overweight 12/09/2010  . CATARACT, SENILE, BILATERAL 12/09/2010  . BRONCHITIS, RECURRENT 12/09/2010  . GERD 12/09/2010  . DIVERTICULAR DISEASE 12/09/2010  . Rheumatoid arthritis 12/09/2010  . LOC OSTEOARTHROS NOT SPEC PRIM/SEC UNSPEC SITE 12/09/2010  . DEGENERATIVE DISC DISEASE, AXIAL SKELETON 12/09/2010  . MEASLES, HX OF 12/09/2010  . COLONIC POLYPS, HX OF 12/09/2010  . Multiple allergies 05/22/2011    Past Surgical History  Procedure Date  . Appendectomy 1953  . Cesarean section 1965    RH neg  . Polypectomy 2009    Family History  Problem Relation Age of Onset  . Alcohol abuse Mother   . Asthma Mother   . Obesity Mother   . Aneurysm Father     abdominal aortic  . Heart attack Sister   . Obesity Sister   . Hypertension Sister   . Cancer Brother     abdominal, bone  . Colon polyps Daughter   . Heart disease Paternal Grandfather   . Cancer Sister     skin, recurrent breast cancer s/p mastectomy, declined other  treatment  . Obesity Sister   .  Hypertension Sister   . Stroke Brother     hemorrhagic  . Hypertension Brother   . Diabetes Brother   . Arthritis Brother   . Hypertension Brother   . Arthritis Daughter   . Hyperlipidemia Daughter   . Obesity Daughter   . Colon polyps Daughter     precancerous  . Endometriosis Daughter     History   Social History  . Marital Status: Widowed    Spouse Name: N/A    Number of Children: N/A  . Years of Education: N/A   Occupational History  . Not on file.   Social History Main Topics  . Smoking status: Never Smoker   . Smokeless tobacco: Never Used  . Alcohol Use: No  . Drug Use: No  . Sexually Active: No   Other Topics Concern  . Not on file   Social History Narrative  . No narrative on file    Current Outpatient Prescriptions on File Prior to Visit  Medication Sig Dispense Refill  . albuterol (PROVENTIL HFA;VENTOLIN HFA) 108 (90 BASE) MCG/ACT inhaler Inhale 2 puffs into the lungs every 6 (six) hours as needed for wheezing.  1 Inhaler  0  . ascorbic acid (VITAMIN C) 500 MG tablet Take 500 mg by mouth 3 (three) times daily. Vitamin E       . Calcium Carbonate (CALCIUM 600 PO) Take by mouth daily.        Marland Kitchen  Cholecalciferol (VITAMIN D3) 1000 UNITS capsule Take 1,000 Units by mouth 2 (two) times daily.       . Coenzyme Q10 (COQ10) 100 MG capsule Take 100 mg by mouth daily.        . fish oil-omega-3 fatty acids 1000 MG capsule Take 1 g by mouth 3 (three) times daily.       Marland Kitchen loratadine (CLARITIN) 10 MG tablet Take 1 tablet (10 mg total) by mouth daily.  30 tablet  2  . NON FORMULARY Colestol.  Take 2 tablets by mouth twice daily.      . Nutritional Supplements (JUICE PLUS FIBRE PO) Take 4 tablets by mouth daily.        . Saline 0.65 % (SOLN) SOLN Place 2 sprays into the nose 2 (two) times daily as needed.  1 Bottle  0    Allergies  Allergen Reactions  . Codeine   . Esomeprazole Magnesium     Review of Systems  Review of Systems  Constitutional: Negative for  fever, chills and malaise/fatigue.  HENT: Negative for hearing loss, nosebleeds and congestion.   Eyes: Negative for discharge.  Respiratory: Negative for cough, sputum production, shortness of breath and wheezing.   Cardiovascular: Negative for chest pain, palpitations and leg swelling.  Gastrointestinal: Negative for heartburn, nausea, vomiting, abdominal pain, diarrhea, constipation and blood in stool.  Genitourinary: Negative for dysuria, urgency, frequency and hematuria.  Musculoskeletal: Positive for joint pain. Negative for myalgias, back pain and falls.       Left wrist and b/l shoulder pain  Skin: Negative for rash.  Neurological: Negative for dizziness, tremors, sensory change, focal weakness, loss of consciousness, weakness and headaches.  Endo/Heme/Allergies: Negative for polydipsia. Does not bruise/bleed easily.  Psychiatric/Behavioral: Negative for depression and suicidal ideas. The patient is not nervous/anxious and does not have insomnia.     Objective  BP 136/84  Pulse 66  Temp(Src) 97.6 F (36.4 C) (Oral)  Ht 5' 2.75" (1.594 m)  Wt 166 lb 6.4 oz (75.479 kg)  BMI 29.71 kg/m2  SpO2 99%  Physical Exam  Physical Exam  Constitutional: She is oriented to person, place, and time and well-developed, well-nourished, and in no distress. No distress.  HENT:  Head: Normocephalic and atraumatic.  Right Ear: External ear normal.  Left Ear: External ear normal.  Nose: Nose normal.  Mouth/Throat: Oropharynx is clear and moist. No oropharyngeal exudate.  Eyes: Conjunctivae are normal. Pupils are equal, round, and reactive to light. Right eye exhibits no discharge. Left eye exhibits no discharge. No scleral icterus.  Neck: Normal range of motion. Neck supple. No thyromegaly present.  Cardiovascular: Normal rate, regular rhythm, normal heart sounds and intact distal pulses.   No murmur heard. Pulmonary/Chest: Effort normal and breath sounds normal. No respiratory distress. She  has no wheezes. She has no rales.  Abdominal: Soft. Bowel sounds are normal. She exhibits no distension and no mass. There is no tenderness.  Musculoskeletal: Normal range of motion. She exhibits no edema and no tenderness.  Lymphadenopathy:    She has no cervical adenopathy.  Neurological: She is alert and oriented to person, place, and time. She has normal reflexes. No cranial nerve deficit. Coordination normal.  Skin: Skin is warm and dry. No rash noted. She is not diaphoretic.  Psychiatric: Mood, memory and affect normal.       Assessment & Plan  CTS (carpal tunnel syndrome) Has a brace she uses intermittently, her symptoms are improving. Some numbness in fingers comes and  goes. Encouraged ice and  aspercreme at least bid  Rheumatoid arthritis Seen by Rheumatology for b/l shoulder pain, had injections last 3 months ago, she was sent to orthopaedics for her left CTS, she reports they did an xray of the wrist and she notes they say it is confirmed CTS in her left wrist  Multiple allergies No symptoms at present, may use Loratatidine prn  MIXED HYPERLIPIDEMIA Avoid trans fats, increase exercise, add fiber supplement and continue fish oil caps daily  DIVERTICULAR DISEASE No difficulties, encouraged hi fiber diet and good hydration  GERD Improved with dietary changes no need for medications at this time

## 2011-11-16 ENCOUNTER — Ambulatory Visit (INDEPENDENT_AMBULATORY_CARE_PROVIDER_SITE_OTHER): Payer: Medicare Other | Admitting: Family Medicine

## 2011-11-16 ENCOUNTER — Encounter: Payer: Self-pay | Admitting: Family Medicine

## 2011-11-16 DIAGNOSIS — K529 Noninfective gastroenteritis and colitis, unspecified: Secondary | ICD-10-CM

## 2011-11-16 DIAGNOSIS — R42 Dizziness and giddiness: Secondary | ICD-10-CM

## 2011-11-16 DIAGNOSIS — K5289 Other specified noninfective gastroenteritis and colitis: Secondary | ICD-10-CM

## 2011-11-16 DIAGNOSIS — R300 Dysuria: Secondary | ICD-10-CM

## 2011-11-16 LAB — POCT URINALYSIS DIPSTICK
Bilirubin, UA: NEGATIVE
Blood, UA: NEGATIVE
Glucose, UA: NEGATIVE
Nitrite, UA: NEGATIVE

## 2011-11-16 LAB — RENAL FUNCTION PANEL
Albumin: 4.5 g/dL (ref 3.5–5.2)
CO2: 27 mEq/L (ref 19–32)
Chloride: 106 mEq/L (ref 96–112)
Glucose, Bld: 95 mg/dL (ref 70–99)
Potassium: 4.9 mEq/L (ref 3.5–5.3)
Sodium: 142 mEq/L (ref 135–145)

## 2011-11-16 LAB — CBC
Hemoglobin: 13.7 g/dL (ref 12.0–15.0)
MCH: 32.5 pg (ref 26.0–34.0)
RBC: 4.21 MIL/uL (ref 3.87–5.11)
WBC: 7.4 10*3/uL (ref 4.0–10.5)

## 2011-11-16 NOTE — Progress Notes (Signed)
Patient ID: [name], female   DOB: 05-Feb-1935, 76 y.o.   MRN: [id number] TYCHELLE PURKEY [id number] September 17, 1935 11/16/2011      Progress Note-Follow Up  Subjective  Chief Complaint  Chief Complaint  Patient presents with  . stomach problem    cramps all over stomach and nausea X 2 weeks  . off balance    X 2 weeks    HPI  76 year old Caucasian female who is in today complaining of two-week history of mild stomach upset. She's had some intermittent nausea and abdominal cramping which has been tolerable but intermittently present for 2 weeks now. No diarrhea or constipation. No vomiting or anorexia. No fevers, chills. She has noted some urinary frequency as well as some mild dysuria however. No chest pain, palpitations, congestion. She has had some flare in her shoulder pain and fibromyalgia. As well as some tingling and numbness in her left fingers. She's been diagnosed with carpal tunnel syndrome in the left hand by Dr. Glenetta Hew. She started some vitamin B6 and she does believe that's been somewhat helpful  Past Medical History  Diagnosis Date  . Hyperlipidemia   . Overweight 12/09/2010  . CATARACT, SENILE, BILATERAL 12/09/2010  . BRONCHITIS, RECURRENT 12/09/2010  . GERD 12/09/2010  . DIVERTICULAR DISEASE 12/09/2010  . Rheumatoid arthritis 12/09/2010  . LOC OSTEOARTHROS NOT SPEC PRIM/SEC UNSPEC SITE 12/09/2010  . DEGENERATIVE DISC DISEASE, AXIAL SKELETON 12/09/2010  . MEASLES, HX OF 12/09/2010  . COLONIC POLYPS, HX OF 12/09/2010  . Multiple allergies 05/22/2011  . Gastroenteritis 11/16/2011  . Disequilibrium 11/16/2011    Past Surgical History  Procedure Date  . Appendectomy 1953  . Cesarean section 1965    RH neg  . Polypectomy 2009    Family History  Problem Relation Age of Onset  . Alcohol abuse Mother   . Asthma Mother   . Obesity Mother   . Aneurysm Father     abdominal aortic  . Heart attack Sister   . Obesity Sister   . Hypertension Sister   . Cancer Brother    abdominal, bone  . Colon polyps Daughter   . Heart disease Paternal Grandfather   . Cancer Sister     skin, recurrent breast cancer s/p mastectomy, declined other  treatment  . Obesity Sister   . Hypertension Sister   . Stroke Brother     hemorrhagic  . Hypertension Brother   . Diabetes Brother   . Arthritis Brother   . Hypertension Brother   . Arthritis Daughter   . Hyperlipidemia Daughter   . Obesity Daughter   . Colon polyps Daughter     precancerous  . Endometriosis Daughter     History   Social History  . Marital Status: Widowed    Spouse Name: N/A    Number of Children: N/A  . Years of Education: N/A   Occupational History  . Not on file.   Social History Main Topics  . Smoking status: Never Smoker   . Smokeless tobacco: Never Used  . Alcohol Use: No  . Drug Use: No  . Sexually Active: No   Other Topics Concern  . Not on file   Social History Narrative  . No narrative on file    Current Outpatient Prescriptions on File Prior to Visit  Medication Sig Dispense Refill  . ascorbic acid (VITAMIN C) 500 MG tablet Take 500 mg by mouth 3 (three) times daily. Vitamin E       . Calcium Carbonate (CALCIUM  600 PO) Take by mouth daily.        . Cholecalciferol (VITAMIN D3) 1000 UNITS capsule Take 1,000 Units by mouth 2 (two) times daily.       . Coenzyme Q10 (COQ10) 100 MG capsule Take 1,000 mg by mouth daily.       . fish oil-omega-3 fatty acids 1000 MG capsule Take 1 g by mouth 3 (three) times daily.       . NON FORMULARY 2,900 mg 2 (two) times daily. Colestol.  Take 2 tablets by mouth twice daily.      . Saline 0.65 % (SOLN) SOLN Place 2 sprays into the nose 2 (two) times daily as needed.  1 Bottle  0  . albuterol (PROVENTIL HFA;VENTOLIN HFA) 108 (90 BASE) MCG/ACT inhaler Inhale 2 puffs into the lungs every 6 (six) hours as needed for wheezing.  1 Inhaler  0  . loratadine (CLARITIN) 10 MG tablet Take 1 tablet (10 mg total) by mouth daily.  30 tablet  2     Allergies  Allergen Reactions  . Codeine   . Esomeprazole Magnesium     Review of Systems  Review of Systems  Constitutional: Negative for fever and malaise/fatigue.  HENT: Negative for congestion.   Eyes: Negative for discharge.  Respiratory: Negative for shortness of breath.   Cardiovascular: Negative for chest pain, palpitations and leg swelling.  Gastrointestinal: Negative for nausea, abdominal pain and diarrhea.  Genitourinary: Negative for dysuria.  Musculoskeletal: Negative for falls.  Skin: Negative for rash.  Neurological: Negative for loss of consciousness and headaches.  Endo/Heme/Allergies: Negative for polydipsia.  Psychiatric/Behavioral: Negative for depression and suicidal ideas. The patient is not nervous/anxious and does not have insomnia.     Objective  BP 119/75  Pulse 59  Temp(Src) 98 F (36.7 C) (Oral)  Ht 5' 2.75" (1.594 m)  Wt 163 lb 1.9 oz (73.991 kg)  BMI 29.13 kg/m2  SpO2 97%  Physical Exam  Physical Exam  Constitutional: She is oriented to person, place, and time and well-developed, well-nourished, and in no distress. No distress.  HENT:  Head: Normocephalic and atraumatic.  Eyes: Conjunctivae are normal.  Neck: Neck supple. No thyromegaly present.  Cardiovascular: Normal rate, regular rhythm and normal heart sounds.   No murmur heard. Pulmonary/Chest: Effort normal and breath sounds normal. She has no wheezes.  Abdominal: She exhibits no distension and no mass.  Musculoskeletal: She exhibits no edema.  Lymphadenopathy:    She has no cervical adenopathy.  Neurological: She is alert and oriented to person, place, and time.  Skin: Skin is warm and dry. No rash noted. She is not diaphoretic.  Psychiatric: Memory, affect and judgment normal.    Lab Results  Component Value Date   TSH 1.39 08/31/2011   Lab Results  Component Value Date   WBC 5.8 08/31/2011   HGB 13.5 08/31/2011   HCT 40.6 08/31/2011   MCV 97.7 08/31/2011   PLT  239.0 08/31/2011   Lab Results  Component Value Date   CREATININE 0.9 08/31/2011   BUN 26* 08/31/2011   NA 142 08/31/2011   K 4.5 08/31/2011   CL 107 08/31/2011   CO2 25 08/31/2011   Lab Results  Component Value Date   ALT 16 08/31/2011   AST 32 08/31/2011   ALKPHOS 39 08/31/2011   BILITOT 0.6 08/31/2011   Lab Results  Component Value Date   CHOL 249* 08/31/2011   Lab Results  Component Value Date   HDL 54.50 08/31/2011  No results found for this basename: LDLCALC   Lab Results  Component Value Date   TRIG 102.0 08/31/2011   Lab Results  Component Value Date   CHOLHDL 5 08/31/2011     Assessment & Plan  Gastroenteritis Symptoms worsen at night and present for about 2 weeks, encouraged to continue fiber, flax, yogurt daily. Avoid spicy and fatty foods, restart a probiotic, and report any worsening symptoms. Consider ginger as needed. Will check a cbc and renal  Disequilibrium Patient reports decreased po fluids since she has not been feeling well. Increase po fluids and ad a Gatorade daily

## 2011-11-16 NOTE — Assessment & Plan Note (Addendum)
Patient reports decreased po fluids since she has not been feeling well. Increase po fluids and ad a Gatorade daily

## 2011-11-16 NOTE — Assessment & Plan Note (Addendum)
Symptoms worsen at night and present for about 2 weeks, encouraged to continue fiber, flax, yogurt daily. Avoid spicy and fatty foods, restart a probiotic, and report any worsening symptoms. Consider ginger as needed. Will check a cbc and renal

## 2011-11-16 NOTE — Patient Instructions (Signed)
Viral Gastroenteritis Viral gastroenteritis is also known as stomach flu. This condition affects the stomach and intestinal tract. The illness typically lasts 3 to 8 days. Most people develop an immune response. This eventually gets rid of the virus. While this natural response develops, the virus can make you quite ill.  CAUSES  Diarrhea and vomiting are often caused by a virus. Medicines (antibiotics) that kill germs will not help unless there is also a germ (bacterial) infection. SYMPTOMS  The most common symptom is diarrhea. This can cause severe loss of fluids (dehydration) and body salt (electrolyte) imbalance. TREATMENT  Treatments for this illness are aimed at rehydration. Antidiarrheal medicines are not recommended. They do not decrease diarrhea volume and may be harmful. Usually, home treatment is all that is needed. The most serious cases involve vomiting so severely that you are not able to keep down fluids taken by mouth (orally). In these cases, intravenous (IV) fluids are needed. Vomiting with viral gastroenteritis is common, but it will usually go away with treatment. HOME CARE INSTRUCTIONS  Small amounts of fluids should be taken frequently. Large amounts at one time may not be tolerated. Plain water may be harmful in infants and the elderly. Oral rehydration solutions (ORS) are available at pharmacies and grocery stores. ORS replace water and important electrolytes in proper proportions. Sports drinks are not as effective as ORS and may be harmful due to sugars worsening diarrhea.  As a general guideline for children, replace any new fluid losses from diarrhea or vomiting with ORS as follows:   If your child weighs 22 pounds or under (10 kg or less), give 60-120 mL (1/4 - 1/2 cup or 2 - 4 ounces) of ORS for each diarrheal stool or vomiting episode.   If your child weighs more than 22 pounds (more than 10 kgs), give 120-240 mL (1/2 - 1 cup or 4 - 8 ounces) of ORS for each diarrheal  stool or vomiting episode.   In a child with vomiting, it may be helpful to give the above ORS replacement in 5 mL (1 teaspoon) amounts every 5 minutes, then increase as tolerated.   While correcting for dehydration, children should eat normally. However, foods high in sugar should be avoided because this may worsen diarrhea. Large amounts of carbonated soft drinks, juice, gelatin desserts, and other highly sugared drinks should be avoided.   After correction of dehydration, other liquids that are appealing to the child may be added. Children should drink small amounts of fluids frequently and fluids should be increased as tolerated.   Adults should eat normally while drinking more fluids than usual. Drink small amounts of fluids frequently and increase as tolerated. Drink enough water and fluids to keep your urine clear or pale yellow. Broths, weak decaffeinated tea, lemon-lime soft drinks (allowed to go flat), and ORS replace fluids and electrolytes.   Avoid:   Carbonated drinks.   Juice.   Extremely hot or cold fluids.   Caffeine drinks.   Fatty, greasy foods.   Alcohol.   Tobacco.   Too much intake of anything at one time.   Gelatin desserts.   Probiotics are active cultures of beneficial bacteria. They may lessen the amount and number of diarrheal stools in adults. Probiotics can be found in yogurt with active cultures and in supplements.   Wash your hands well to avoid spreading bacteria and viruses.   Antidiarrheal medicines are not recommended for infants and children.   Only take over-the-counter or prescription medicines for   pain, discomfort, or fever as directed by your caregiver. Do not give aspirin to children.   For adults with dehydration, ask your caregiver if you should continue all prescribed and over-the-counter medicines.   If your caregiver has given you a follow-up appointment, it is very important to keep that appointment. Not keeping the appointment  could result in a lasting (chronic) or permanent injury and disability. If there is any problem keeping the appointment, you must call to reschedule.  SEEK IMMEDIATE MEDICAL CARE IF:   You are unable to keep fluids down.   There is no urine output in 6 to 8 hours or there is only a small amount of very dark urine.   You develop shortness of breath.   There is blood in the vomit (may look like coffee grounds) or stool.   Belly (abdominal) pain develops, increases, or localizes.   There is persistent vomiting or diarrhea.   You have a fever.   Your baby is older than 3 months with a rectal temperature of 102 F (38.9 C) or higher.   Your baby is 8 months old or younger with a rectal temperature of 100.4 F (38 C) or higher.  MAKE SURE YOU:   Understand these instructions.   Will watch your condition.   Will get help right away if you are not doing well or get worse.  Document Released: 11/16/2005 Document Revised: 07/29/2011 Document Reviewed: 03/30/2007 [facility] Patient Information 2012 [place], [place]. Add a Gatorade daily and add a probiotic daily, call if symptoms worsen

## 2011-12-30 ENCOUNTER — Ambulatory Visit (INDEPENDENT_AMBULATORY_CARE_PROVIDER_SITE_OTHER): Payer: Medicare Other | Admitting: Family Medicine

## 2011-12-30 ENCOUNTER — Encounter: Payer: Self-pay | Admitting: Family Medicine

## 2011-12-30 VITALS — BP 134/82 | HR 71 | Temp 99.0°F | Ht 62.75 in | Wt 163.8 lb

## 2011-12-30 DIAGNOSIS — H00019 Hordeolum externum unspecified eye, unspecified eyelid: Secondary | ICD-10-CM

## 2011-12-30 DIAGNOSIS — I739 Peripheral vascular disease, unspecified: Secondary | ICD-10-CM

## 2011-12-30 DIAGNOSIS — I779 Disorder of arteries and arterioles, unspecified: Secondary | ICD-10-CM

## 2011-12-30 DIAGNOSIS — M79609 Pain in unspecified limb: Secondary | ICD-10-CM

## 2011-12-30 DIAGNOSIS — M79606 Pain in leg, unspecified: Secondary | ICD-10-CM

## 2011-12-30 MED ORDER — POLYMYXIN B-TRIMETHOPRIM 10000-0.1 UNIT/ML-% OP SOLN: 2.0000 [drp] | Freq: Four times a day (QID) | OPHTHALMIC | Status: AC

## 2011-12-30 NOTE — Patient Instructions (Signed)
Peripheral Vascular Disease Peripheral Vascular Disease (PVD), also called Peripheral Arterial Disease (PAD), is a circulation problem caused by cholesterol (atherosclerotic plaque) deposits in the arteries. PVD commonly occurs in the lower extremities (legs) but it can occur in other areas of the body, such as your arms. The cholesterol buildup in the arteries reduces blood flow which can cause pain and other serious problems. The presence of PVD can place a person at risk for Coronary Artery Disease (CAD).  CAUSES  Causes of PVD can be many. It is usually associated with more than one risk factor such as:   High Cholesterol.   Smoking.   Diabetes.   Lack of exercise or inactivity.   High blood pressure (hypertension).   Obesity.   Family history.  SYMPTOMS   When the lower extremities are affected, patients with PVD may experience:   Leg pain with exertion or physical activity. This is called INTERMITTENT CLAUDICATION. This may present as cramping or numbness with physical activity. The location of the pain is associated with the level of blockage. For example, blockage at the abdominal level (distal abdominal aorta) may result in buttock or hip pain. Lower leg arterial blockage may result in calf pain.   As PVD becomes more severe, pain can develop with less physical activity.   In people with severe PVD, leg pain may occur at rest.   Other PVD signs and symptoms:   Leg numbness or weakness.   Coldness in the affected leg or foot, especially when compared to the other leg.   A change in leg color.   Patients with significant PVD are more prone to ulcers or sores on toes, feet or legs. These may take longer to heal or may reoccur. The ulcers or sores can become infected.   If signs and symptoms of PVD are ignored, gangrene may occur. This can result in the loss of toes or loss of an entire limb.   Not all leg pain is related to PVD. Other medical conditions can cause leg  pain such as:   Blood clots (embolism) or Deep Vein Thrombosis.   Inflammation of the blood vessels (vasculitis).   Spinal stenosis.  DIAGNOSIS  Diagnosis of PVD can involve several different types of tests. These can include:  Pulse Volume Recording Method (PVR). This test is simple, painless and does not involve the use of X-rays. PVR involves measuring and comparing the blood pressure in the arms and legs. An ABI (Ankle-Brachial Index) is calculated. The normal ratio of blood pressures is 1. As this number becomes smaller, it indicates more severe disease.   < 0.95 - indicates significant narrowing in one or more leg vessels.   <0.8 - there will usually be pain in the foot, leg or buttock with exercise.   <0.4 - will usually have pain in the legs at rest.   <0.25 - usually indicates limb threatening PVD.   Doppler detection of pulses in the legs. This test is painless and checks to see if you have a pulses in your legs/feet.   A dye or contrast material (a substance that highlights the blood vessels so they show up on x-ray) may be given to help your caregiver better see the arteries for the following tests. The dye is eliminated from your body by the kidney's. Your caregiver may order blood work to check your kidney function and other laboratory values before the following tests are performed:   Magnetic Resonance Angiography (MRA). An MRA is a picture   study of the blood vessels and arteries. The MRA machine uses a large magnet to produce images of the blood vessels.   Computed Tomography Angiography (CTA). A CTA is a specialized x-ray that looks at how the blood flows in your blood vessels. An IV may be inserted into your arm so contrast dye can be injected.   Angiogram. Is a procedure that uses x-rays to look at your blood vessels. This procedure is minimally invasive, meaning a small incision (cut) is made in your groin. A small tube (catheter) is then inserted into the artery of  your groin. The catheter is guided to the blood vessel or artery your caregiver wants to examine. Contrast dye is injected into the catheter. X-rays are then taken of the blood vessel or artery. After the images are obtained, the catheter is taken out.  TREATMENT  Treatment of PVD involves many interventions which may include:  Lifestyle changes:   Quitting smoking.   Exercise.   Following a low fat, low cholesterol diet.   Control of diabetes.   Foot care is very important to the PVD patient. Good foot care can help prevent infection.   Medication:   Cholesterol-lowering medicine.   Blood pressure medicine.   Anti-platelet drugs.   Certain medicines may reduce symptoms of Intermittent Claudication.   Interventional/Surgical options:   Angioplasty. An Angioplasty is a procedure that inflates a balloon in the blocked artery. This opens the blocked artery to improve blood flow.   Stent Implant. A wire mesh tube (stent) is placed in the artery. The stent expands and stays in place, allowing the artery to remain open.   Peripheral Bypass Surgery. This is a surgical procedure that reroutes the blood around a blocked artery to help improve blood flow. This type of procedure may be performed if Angioplasty or stent implants are not an option.  SEEK IMMEDIATE MEDICAL CARE IF:   You develop pain or numbness in your arms or legs.   Your arm or leg turns cold, becomes blue in color.   You develop redness, warmth, swelling and pain in your arms or legs.  MAKE SURE YOU:   Understand these instructions.   Will watch your condition.   Will get help right away if you are not doing well or get worse.  Document Released: 12/24/2004 Document Revised: 07/29/2011 Document Reviewed: 11/20/2008 [employer] Patient Information 2012 [employer]. 

## 2011-12-30 NOTE — Assessment & Plan Note (Addendum)
polytrim will be prescribed for intermittent styes in b/l eyes. Continue warm compresses several times a day. Follow up as needed here or with optometry

## 2011-12-31 ENCOUNTER — Encounter: Payer: Self-pay | Admitting: Family Medicine

## 2011-12-31 ENCOUNTER — Other Ambulatory Visit: Payer: Self-pay | Admitting: *Deleted

## 2011-12-31 DIAGNOSIS — R609 Edema, unspecified: Secondary | ICD-10-CM

## 2011-12-31 DIAGNOSIS — G459 Transient cerebral ischemic attack, unspecified: Secondary | ICD-10-CM

## 2011-12-31 DIAGNOSIS — M79606 Pain in leg, unspecified: Secondary | ICD-10-CM | POA: Insufficient documentation

## 2011-12-31 NOTE — Assessment & Plan Note (Signed)
Patient reports having ultrasound studies over a year ago which showed mild disease, will repeat doppler studies

## 2011-12-31 NOTE — Progress Notes (Signed)
Patient ID: [name], female   DOB: 06-05-1935, 77 y.o.   MRN: [id number] [name] [id number] 10-17-1935 12/31/2011      Progress Note-Follow Up  Subjective  Chief Complaint  Chief Complaint  Patient presents with  . Stye    in both eyes X 6 weeks    HPI  Patient is a 77 yo caucasian female in today for evaluation of styes she has been struggling with in b/l eyes for roughly 6 weeks. They improve and then worsen, she has been applying hot compresses several times a day. They are enlarged and tender at times and then they nearly resolve. The left eye is worse than the right at this time. No fevers, chills, congestion, sore throat, cp, palp, sob, gi or gu c/o. She reports having carotid dopplers in the past which showed mild carotid artery disease in the past. She also notes pain in b/l lower extremities with ambulation. This is long standing she had just not thought to mention it previously. She has changed her diet and has been exercising regularly and she thinks it is actually slightly improved. The discomfort resolves with rest.  Past Medical History  Diagnosis Date  . Hyperlipidemia   . Overweight 12/09/2010  . CATARACT, SENILE, BILATERAL 12/09/2010  . BRONCHITIS, RECURRENT 12/09/2010  . GERD 12/09/2010  . DIVERTICULAR DISEASE 12/09/2010  . Rheumatoid arthritis 12/09/2010  . LOC OSTEOARTHROS NOT SPEC PRIM/SEC UNSPEC SITE 12/09/2010  . DEGENERATIVE DISC DISEASE, AXIAL SKELETON 12/09/2010  . MEASLES, HX OF 12/09/2010  . COLONIC POLYPS, HX OF 12/09/2010  . Multiple allergies 05/22/2011  . Gastroenteritis 11/16/2011  . Disequilibrium 11/16/2011  . Carotid artery disease   . PVD (peripheral vascular disease)   . Lower extremity pain 12/31/2011    Past Surgical History  Procedure Date  . Appendectomy 1953  . Cesarean section 1965    RH neg  . Polypectomy 2009    Family History  Problem Relation Age of Onset  . Alcohol abuse Mother   . Asthma Mother   . Obesity Mother   .  Aneurysm Father     abdominal aortic  . Heart attack Sister   . Obesity Sister   . Hypertension Sister   . Cancer Brother     abdominal, bone  . Colon polyps Daughter   . Heart disease Paternal Grandfather   . Cancer Sister     skin, recurrent breast cancer s/p mastectomy, declined other  treatment  . Obesity Sister   . Hypertension Sister   . Stroke Brother     hemorrhagic  . Hypertension Brother   . Diabetes Brother   . Arthritis Brother   . Hypertension Brother   . Arthritis Daughter   . Hyperlipidemia Daughter   . Obesity Daughter   . Colon polyps Daughter     precancerous  . Endometriosis Daughter     History   Social History  . Marital Status: Widowed    Spouse Name: N/A    Number of Children: N/A  . Years of Education: N/A   Occupational History  . Not on file.   Social History Main Topics  . Smoking status: Never Smoker   . Smokeless tobacco: Never Used  . Alcohol Use: No  . Drug Use: No  . Sexually Active: No   Other Topics Concern  . Not on file   Social History Narrative  . No narrative on file    Current Outpatient Prescriptions on File Prior to Visit  Medication Sig Dispense Refill  . ascorbic acid (VITAMIN C) 500 MG tablet Take 500 mg by mouth 3 (three) times daily. Vitamin E       . b complex vitamins tablet Take 1 tablet by mouth daily.        . Calcium Carbonate (CALCIUM 600 PO) Take by mouth daily.        . Cholecalciferol (VITAMIN D3) 1000 UNITS capsule Take 1,000 Units by mouth 2 (two) times daily.       . Coenzyme Q10 (COQ10) 100 MG capsule Take 1,000 mg by mouth daily.       . fish oil-omega-3 fatty acids 1000 MG capsule Take 1 g by mouth 3 (three) times daily.       . Magnesium 500 MG CAPS Take 1 capsule by mouth daily.        . Multiple Vitamin (MULTIVITAMIN) tablet Take 1 tablet by mouth daily.        . NON FORMULARY 2,900 mg 2 (two) times daily. Colestol.  Take 2 tablets by mouth twice daily.      Marland Kitchen pyridoxine (B-6) 100 MG  tablet Take 100 mg by mouth daily.        . Saline 0.65 % (SOLN) SOLN Place 2 sprays into the nose 2 (two) times daily as needed.  1 Bottle  0    Allergies  Allergen Reactions  . Codeine   . Esomeprazole Magnesium     Review of Systems  Review of Systems  Constitutional: Negative for fever and malaise/fatigue.  HENT: Negative for congestion.   Eyes: Positive for pain. Negative for discharge.       Slightly uncomfortable styes b/l eyes improving and worsening over the past 6 weeks  Respiratory: Negative for shortness of breath.   Cardiovascular: Positive for claudication. Negative for chest pain, palpitations and leg swelling.  Gastrointestinal: Negative for nausea, abdominal pain and diarrhea.  Genitourinary: Negative for dysuria.  Musculoskeletal: Negative for falls.  Skin: Negative for rash.  Neurological: Negative for loss of consciousness and headaches.  Endo/Heme/Allergies: Negative for polydipsia.  Psychiatric/Behavioral: Negative for depression and suicidal ideas. The patient is not nervous/anxious and does not have insomnia.     Objective  BP 134/82  Pulse 71  Temp(Src) 99 F (37.2 C) (Temporal)  Ht 5' 2.75" (1.594 m)  Wt 163 lb 12.8 oz (74.299 kg)  BMI 29.25 kg/m2  SpO2 98%  Physical Exam  Physical Exam  Constitutional: She is oriented to person, place, and time and well-developed, well-nourished, and in no distress. No distress.  HENT:  Head: Normocephalic and atraumatic.  Eyes: Conjunctivae are normal.       Small, erythematous stye at lash line of lower lid of left eye along outer aspect of eye. Stye on right eyelid very slightly visible  Neck: Neck supple. No thyromegaly present.  Cardiovascular: Normal rate, regular rhythm and normal heart sounds.   No murmur heard. Pulmonary/Chest: Effort normal and breath sounds normal. She has no wheezes.  Abdominal: She exhibits no distension and no mass.  Musculoskeletal: She exhibits no edema.  Lymphadenopathy:     She has no cervical adenopathy.  Neurological: She is alert and oriented to person, place, and time.  Skin: Skin is warm and dry. No rash noted. She is not diaphoretic.  Psychiatric: Memory, affect and judgment normal.    Lab Results  Component Value Date   TSH 1.39 08/31/2011   Lab Results  Component Value Date   WBC 7.4 11/16/2011  HGB 13.7 11/16/2011   HCT 40.1 11/16/2011   MCV 95.2 11/16/2011   PLT 289 11/16/2011   Lab Results  Component Value Date   CREATININE 1.07 11/16/2011   BUN 21 11/16/2011   NA 142 11/16/2011   K 4.9 11/16/2011   CL 106 11/16/2011   CO2 27 11/16/2011   Lab Results  Component Value Date   ALT 16 08/31/2011   AST 32 08/31/2011   ALKPHOS 39 08/31/2011   BILITOT 0.6 08/31/2011   Lab Results  Component Value Date   CHOL 249* 08/31/2011   Lab Results  Component Value Date   HDL 54.50 08/31/2011   No results found for this basename: [facility]   Lab Results  Component Value Date   TRIG 102.0 08/31/2011   Lab Results  Component Value Date   CHOLHDL 5 08/31/2011     Assessment & Plan  Stye polytrim will be prescribed for intermittent styes in b/l eyes. Continue warm compresses several times a day. Follow up as needed here or with optometry  Carotid artery disease Patient reports having ultrasound studies over a year ago which showed mild disease, will repeat doppler studies  PVD (peripheral vascular disease) Patient reporting claudication with ambulation in b/l lower extremities, pain resolves with rest. She reports she has been exercising regularly and actually thinks this has gotten better slightly. We will order lower extremity dopplers to further investigate.

## 2011-12-31 NOTE — Assessment & Plan Note (Signed)
Patient reporting claudication with ambulation in b/l lower extremities, pain resolves with rest. She reports she has been exercising regularly and actually thinks this has gotten better slightly. We will order lower extremity dopplers to further investigate.

## 2012-01-01 ENCOUNTER — Encounter (INDEPENDENT_AMBULATORY_CARE_PROVIDER_SITE_OTHER): Payer: Medicare Other | Admitting: *Deleted

## 2012-01-01 DIAGNOSIS — I6529 Occlusion and stenosis of unspecified carotid artery: Secondary | ICD-10-CM

## 2012-01-01 DIAGNOSIS — I999 Unspecified disorder of circulatory system: Secondary | ICD-10-CM

## 2012-01-01 DIAGNOSIS — G459 Transient cerebral ischemic attack, unspecified: Secondary | ICD-10-CM

## 2012-01-01 DIAGNOSIS — M79606 Pain in leg, unspecified: Secondary | ICD-10-CM

## 2012-01-01 NOTE — Progress Notes (Signed)
Addended by: Danise Edge A on: 01/01/2012 08:44 AM   Modules accepted: Orders

## 2012-01-14 ENCOUNTER — Encounter: Payer: Medicare Other | Admitting: Cardiology

## 2012-01-14 ENCOUNTER — Encounter (INDEPENDENT_AMBULATORY_CARE_PROVIDER_SITE_OTHER): Payer: Medicare Other | Admitting: Cardiology

## 2012-01-14 DIAGNOSIS — I739 Peripheral vascular disease, unspecified: Secondary | ICD-10-CM

## 2012-01-14 DIAGNOSIS — M79606 Pain in leg, unspecified: Secondary | ICD-10-CM

## 2012-02-24 ENCOUNTER — Ambulatory Visit: Payer: Medicare Other | Admitting: Family Medicine

## 2012-02-26 ENCOUNTER — Ambulatory Visit: Payer: Medicare Other | Admitting: Family Medicine

## 2012-07-12 ENCOUNTER — Ambulatory Visit (INDEPENDENT_AMBULATORY_CARE_PROVIDER_SITE_OTHER): Payer: Medicare Other | Admitting: Family Medicine

## 2012-07-12 ENCOUNTER — Encounter: Payer: Self-pay | Admitting: Family Medicine

## 2012-07-12 VITALS — BP 151/80 | HR 76 | Temp 97.6°F | Ht 62.75 in | Wt 161.8 lb

## 2012-07-12 DIAGNOSIS — R030 Elevated blood-pressure reading, without diagnosis of hypertension: Secondary | ICD-10-CM

## 2012-07-12 DIAGNOSIS — E782 Mixed hyperlipidemia: Secondary | ICD-10-CM

## 2012-07-12 DIAGNOSIS — IMO0001 Reserved for inherently not codable concepts without codable children: Secondary | ICD-10-CM

## 2012-07-12 DIAGNOSIS — I889 Nonspecific lymphadenitis, unspecified: Secondary | ICD-10-CM

## 2012-07-12 MED ORDER — AMOXICILLIN 500 MG PO CAPS: 500.0000 mg | ORAL_CAPSULE | Freq: Three times a day (TID) | ORAL | Status: AC

## 2012-07-12 MED ORDER — ALIGN PO CAPS: 1.0000 | ORAL_CAPSULE | Freq: Every day | ORAL | Status: AC

## 2012-07-12 NOTE — Assessment & Plan Note (Signed)
Mild elevation with infection, poor sleep and pain, recheck at next visit or sooner if patient has any concerns. Minimize sodium

## 2012-07-12 NOTE — Patient Instructions (Addendum)
Gingivitis Gingivitis is a form of gum (periodontal) disease that causes redness, soreness, and swelling (inflammation) of your gums. CAUSES The most common cause of gingivitis is poor oral hygiene. A sticky substance made of bacteria, mucus, and food particles (plaque), is deposited on the exposed part of teeth. As plaque builds up, it reacts with the saliva in your mouth to form something called  tartar. Tartar is a hard deposit that becomes trapped around the base of the tooth. Plaque and tartar irritate the gums, leading to the formation of gingivitis. Other factors that increase your risk for gingivitis include:   Tobacco use.   Diabetes.   Older age.   Certain medications.   Certain viral or fungal infections.   Dry mouth.   Hormonal changes such as during pregnancy.   Poor nutrition.   Substance abuse.   Poor fitting dental restorations or appliances.  SYMPTOMS You may notice inflammation of the soft tissue (gingiva) around the teeth. When these tissues become inflamed, they bleed easily, especially during flossing or brushing. The gums may also be:   Tender to the touch.   Bright red, purple red, or have a shiny appearance.   Swollen.   Wearing away from the teeth (receding), which exposes more of the tooth.  Bad breath is often present. Continued infection around teeth can eventually cause cavities and loosen teeth. This may lead to eventual tooth loss. DIAGNOSIS A medical and dental history will be taken. Your mouth, teeth, and gums will be examined. Your dentist will look for soft, swollen purple-red, irritated gums. There may be deposits of plaque and tartar at the base of the teeth. Your gums will be looked at for the degree of redness, puffiness, and bleeding tendencies. Your dentist will see if any of the teeth are loose. X-rays may be taken to see if the inflammation has spread to the supporting structures of the teeth. TREATMENT The goal is to reduce and reverse  the inflammation. Proper treatment can usually reverse the symptoms of gingivitis and prevent further progression of the disease. Have your teeth cleaned. During the cleaning, all plaque and tartar will be removed. Instruction for proper home care will be given. You will need regular professional cleanings and check-ups in the future. HOME CARE INSTRUCTIONS  Brush your teeth twice a day and floss at least once per day. When flossing, it is best to floss first then brush.   Limit sugar between meals and maintain a well-balanced diet.   Even the best dental hygiene will not prevent plaque from developing. It is necessary for you to see your dentist on a regular basis for cleaning and regular checkups.   Your dentist can recommend proper oral hygiene and mouth care and suggest special toothpastes or mouth rinses.   Stop smoking.  SEEK DENTAL OR MEDICAL CARE IF:  You have painful, reddened tissue around your teeth, or you have puffy swollen gums.   You have difficulty chewing.   You notice any loose or infected teeth.   You have swollen glands.   Your gums bleed easily when you brush your teeth or are very tender to the touch.  Document Released: 05/12/2001 Document Revised: 11/05/2011 Document Reviewed: 02/20/2011 [facility] Patient Information 2012 [place], [place].Lymphadenopathy Lymphadenopathy means "disease of the lymph glands." But the term is usually used to describe swollen or enlarged lymph glands, also called lymph nodes. These are the bean-shaped organs found in many locations including the neck, underarm, and groin. Lymph glands are part of  the immune system, which fights infections in your body. Lymphadenopathy can occur in just one area of the body, such as the neck, or it can be generalized, with lymph node enlargement in several areas. The nodes found in the neck are the most common sites of lymphadenopathy. CAUSES  When your immune system responds to germs (such as viruses or  bacteria ), infection-fighting cells and fluid build up. This causes the glands to grow in size. This is usually not something to worry about. Sometimes, the glands themselves can become infected and inflamed. This is called lymphadenitis. Enlarged lymph nodes can be caused by many diseases:  Bacterial disease, such as strep throat or a skin infection.   Viral disease, such as a common cold.   Other germs, such as lyme disease, tuberculosis, or sexually transmitted diseases.   Cancers, such as lymphoma (cancer of the lymphatic system) or leukemia (cancer of the white blood cells).   Inflammatory diseases such as lupus or rheumatoid arthritis.   Reactions to medications.  Many of the diseases above are rare, but important. This is why you should see your caregiver if you have lymphadenopathy. SYMPTOMS   Swollen, enlarged lumps in the neck, back of the head or other locations.   Tenderness.   Warmth or redness of the skin over the lymph nodes.   Fever.  DIAGNOSIS  Enlarged lymph nodes are often near the source of infection. They can help healthcare providers diagnose your illness. For instance:   Swollen lymph nodes around the jaw might be caused by an infection in the mouth.   Enlarged glands in the neck often signal a throat infection.   Lymph nodes that are swollen in more than one area often indicate an illness caused by a virus.  Your caregiver most likely will know what is causing your lymphadenopathy after listening to your history and examining you. Blood tests, x-rays or other tests may be needed. If the cause of the enlarged lymph node cannot be found, and it does not go away by itself, then a biopsy may be needed. Your caregiver will discuss this with you. TREATMENT  Treatment for your enlarged lymph nodes will depend on the cause. Many times the nodes will shrink to normal size by themselves, with no treatment. Antibiotics or other medicines may be needed for infection.  Only take over-the-counter or prescription medicines for pain, discomfort or fever as directed by your caregiver. HOME CARE INSTRUCTIONS  Swollen lymph glands usually return to normal when the underlying medical condition goes away. If they persist, contact your health-care provider. He/she might prescribe antibiotics or other treatments, depending on the diagnosis. Take any medications exactly as prescribed. Keep any follow-up appointments made to check on the condition of your enlarged nodes.  SEEK MEDICAL CARE IF:   Swelling lasts for more than two weeks.   You have symptoms such as weight loss, night sweats, fatigue or fever that does not go away.   The lymph nodes are hard, seem fixed to the skin or are growing rapidly.   Skin over the lymph nodes is red and inflamed. This could mean there is an infection.  SEEK IMMEDIATE MEDICAL CARE IF:   Fluid starts leaking from the area of the enlarged lymph node.   You develop a fever of 102 F (38.9 C) or greater.   Severe pain develops (not necessarily at the site of a large lymph node).   You develop chest pain or shortness of breath.   You  develop worsening abdominal pain.  MAKE SURE YOU:   Understand these instructions.   Will watch your condition.   Will get help right away if you are not doing well or get worse.  Document Released: 08/25/2008 Document Revised: 11/05/2011 Document Reviewed: 08/25/2008 [facility] Patient Information 2012 [place], [place].

## 2012-07-12 NOTE — Assessment & Plan Note (Signed)
Possible gingivitis, started on Amoxicillin 500mg  tid and a probiotic daily and she will call her dentist for further instructions and eval

## 2012-07-12 NOTE — Assessment & Plan Note (Signed)
Fasting labs prior to this visit

## 2012-07-12 NOTE — Progress Notes (Signed)
Patient ID: [name], female   DOB: December 06, 1934, 77 y.o.   MRN: [id number] [name] [id number] 11-Mar-1935 07/12/2012      Progress Note-Follow Up  Subjective  Chief Complaint  Chief Complaint  Patient presents with  . Jaw Pain    pain and swelling in right jaw X 2 days    HPI  This is a 77 year old Caucasian female who is in today for evaluation of jaw pain started yesterday. She just returned from a 5 week visit to [place] and has been struggling with fatigue she got back last week. Yesterday she noted with opening her mouth or chewing especially on that side she had pain in her right posterior jaw. The pain gets as high as 7/10 and is painful to palpation as well. No fevers or chills just malaise. No anorexia, nausea, chest pain, palpitations, shortness of breath, generalized headache, congestion or sore throat noted. She has not tried any medications thus far to help her manage this discomfort  Past Medical History  Diagnosis Date  . Hyperlipidemia   . Overweight 12/09/2010  . CATARACT, SENILE, BILATERAL 12/09/2010  . BRONCHITIS, RECURRENT 12/09/2010  . GERD 12/09/2010  . DIVERTICULAR DISEASE 12/09/2010  . Rheumatoid arthritis 12/09/2010  . LOC OSTEOARTHROS NOT SPEC PRIM/SEC UNSPEC SITE 12/09/2010  . DEGENERATIVE DISC DISEASE, AXIAL SKELETON 12/09/2010  . MEASLES, HX OF 12/09/2010  . COLONIC POLYPS, HX OF 12/09/2010  . Multiple allergies 05/22/2011  . Gastroenteritis 11/16/2011  . Disequilibrium 11/16/2011  . Carotid artery disease   . PVD (peripheral vascular disease)   . Lower extremity pain 12/31/2011  . Lymphadenitis 07/12/2012  . Elevated BP 07/12/2012    Past Surgical History  Procedure Date  . Appendectomy 1953  . Cesarean section 1965    RH neg  . Polypectomy 2009    Family History  Problem Relation Age of Onset  . Alcohol abuse Mother   . Asthma Mother   . Obesity Mother   . Aneurysm Father     abdominal aortic  . Heart attack Sister   . Obesity Sister     . Hypertension Sister   . Cancer Brother     abdominal, bone  . Colon polyps Daughter   . Heart disease Paternal Grandfather   . Cancer Sister     skin, recurrent breast cancer s/p mastectomy, declined other  treatment  . Obesity Sister   . Hypertension Sister   . Stroke Brother     hemorrhagic  . Hypertension Brother   . Diabetes Brother   . Arthritis Brother   . Hypertension Brother   . Arthritis Daughter   . Hyperlipidemia Daughter   . Obesity Daughter   . Colon polyps Daughter     precancerous  . Endometriosis Daughter     History   Social History  . Marital Status: Widowed    Spouse Name: N/A    Number of Children: N/A  . Years of Education: N/A   Occupational History  . Not on file.   Social History Main Topics  . Smoking status: Never Smoker   . Smokeless tobacco: Never Used  . Alcohol Use: No  . Drug Use: No  . Sexually Active: No   Other Topics Concern  . Not on file   Social History Narrative  . No narrative on file    Current Outpatient Prescriptions on File Prior to Visit  Medication Sig Dispense Refill  . ascorbic acid (VITAMIN C) 500 MG tablet Take 500  mg by mouth 3 (three) times daily. Vitamin E       . b complex vitamins tablet Take 1 tablet by mouth daily.        . Calcium Carbonate (CALCIUM 600 PO) Take by mouth daily.        . Cholecalciferol (VITAMIN D3) 1000 UNITS capsule Take 1,000 Units by mouth 2 (two) times daily.       . fish oil-omega-3 fatty acids 1000 MG capsule Take 1 g by mouth 3 (three) times daily.       . Magnesium 500 MG CAPS Take 1 capsule by mouth daily.        . Multiple Vitamin (MULTIVITAMIN) tablet Take 1 tablet by mouth daily.        . NON FORMULARY 2,900 mg 2 (two) times daily. Colestol.  Take 2 tablets by mouth twice daily.      . Saline 0.65 % (SOLN) SOLN Place 2 sprays into the nose 2 (two) times daily as needed.  1 Bottle  0    Allergies  Allergen Reactions  . Codeine   . Esomeprazole Magnesium      Review of Systems  Review of Systems  Constitutional: Positive for malaise/fatigue. Negative for fever and chills.  HENT: Negative for congestion.        Right sided jaw pain since yesterday worse with opening her mouth and chewing.  Eyes: Negative for discharge.  Respiratory: Negative for shortness of breath.   Cardiovascular: Negative for chest pain, palpitations and leg swelling.  Gastrointestinal: Negative for nausea, abdominal pain and diarrhea.  Genitourinary: Negative for dysuria.  Musculoskeletal: Negative for falls.  Skin: Negative for rash.  Neurological: Negative for loss of consciousness and headaches.  Endo/Heme/Allergies: Negative for polydipsia.  Psychiatric/Behavioral: Negative for depression and suicidal ideas. The patient is not nervous/anxious and does not have insomnia.     Objective  BP 151/80  Pulse 76  Temp 97.6 F (36.4 C) (Temporal)  Ht 5' 2.75" (1.594 m)  Wt 161 lb 12.8 oz (73.392 kg)  BMI 28.89 kg/m2  SpO2 97%  Physical Exam  Physical Exam  Constitutional: She is oriented to person, place, and time and well-developed, well-nourished, and in no distress. No distress.  HENT:  Head: Normocephalic and atraumatic.       Adentulous, erythema at posterior upper bum and at middle of lower gum  Eyes: Conjunctivae are normal.  Neck: Neck supple. No thyromegaly present.       Tender swollen LN right submandibular, posterior  Cardiovascular: Normal rate, regular rhythm and normal heart sounds.   No murmur heard. Pulmonary/Chest: Effort normal and breath sounds normal. She has no wheezes.  Abdominal: She exhibits no distension and no mass.  Musculoskeletal: She exhibits no edema.  Lymphadenopathy:    She has no cervical adenopathy.  Neurological: She is alert and oriented to person, place, and time.  Skin: Skin is warm and dry. No rash noted. She is not diaphoretic.  Psychiatric: Memory, affect and judgment normal.    Lab Results  Component  Value Date   TSH 1.39 08/31/2011   Lab Results  Component Value Date   WBC 7.4 11/16/2011   HGB 13.7 11/16/2011   HCT 40.1 11/16/2011   MCV 95.2 11/16/2011   PLT 289 11/16/2011   Lab Results  Component Value Date   CREATININE 1.07 11/16/2011   BUN 21 11/16/2011   NA 142 11/16/2011   K 4.9 11/16/2011   CL 106 11/16/2011   CO2 27  11/16/2011   Lab Results  Component Value Date   ALT 16 08/31/2011   AST 32 08/31/2011   ALKPHOS 39 08/31/2011   BILITOT 0.6 08/31/2011   Lab Results  Component Value Date   CHOL 249* 08/31/2011   Lab Results  Component Value Date   HDL 54.50 08/31/2011   No results found for this basename: [facility]   Lab Results  Component Value Date   TRIG 102.0 08/31/2011   Lab Results  Component Value Date   CHOLHDL 5 08/31/2011     Assessment & Plan  Lymphadenitis Possible gingivitis, started on Amoxicillin 500mg  tid and a probiotic daily and she will call her dentist for further instructions and eval  MIXED HYPERLIPIDEMIA Fasting labs prior to this visit  Elevated BP Mild elevation with infection, poor sleep and pain, recheck at next visit or sooner if patient has any concerns. Minimize sodium

## 2012-08-29 ENCOUNTER — Ambulatory Visit (INDEPENDENT_AMBULATORY_CARE_PROVIDER_SITE_OTHER): Payer: Medicare Other

## 2012-08-29 DIAGNOSIS — Z23 Encounter for immunization: Secondary | ICD-10-CM

## 2012-10-06 ENCOUNTER — Other Ambulatory Visit (INDEPENDENT_AMBULATORY_CARE_PROVIDER_SITE_OTHER): Payer: Medicare Other

## 2012-10-06 DIAGNOSIS — R030 Elevated blood-pressure reading, without diagnosis of hypertension: Secondary | ICD-10-CM

## 2012-10-06 DIAGNOSIS — E782 Mixed hyperlipidemia: Secondary | ICD-10-CM

## 2012-10-06 LAB — CBC
Hemoglobin: 12.8 g/dL (ref 12.0–15.0)
MCHC: 32.6 g/dL (ref 30.0–36.0)
Platelets: 259 10*3/uL (ref 150.0–400.0)
RDW: 14.2 % (ref 11.5–14.6)
WBC: 4.9 10*3/uL (ref 4.5–10.5)

## 2012-10-06 LAB — LIPID PANEL
Cholesterol: 242 mg/dL — ABNORMAL HIGH (ref 0–200)
HDL: 46.9 mg/dL (ref >39.00)
Total CHOL/HDL Ratio: 5
Triglycerides: 140 mg/dL (ref 0.0–149.0)
VLDL: 28 mg/dL (ref 0.0–40.0)

## 2012-10-06 LAB — RENAL FUNCTION PANEL
BUN: 24 mg/dL — ABNORMAL HIGH (ref 6–23)
Creatinine, Ser: 0.9 mg/dL (ref 0.4–1.2)
GFR: 62.36 mL/min (ref >60.00)
Glucose, Bld: 83 mg/dL (ref 70–99)
Phosphorus: 3.4 mg/dL (ref 2.3–4.6)
Sodium: 142 mEq/L (ref 135–145)

## 2012-10-06 LAB — HEPATIC FUNCTION PANEL
Albumin: 3.9 g/dL (ref 3.5–5.2)
Total Protein: 6.6 g/dL (ref 6.0–8.3)

## 2012-10-06 LAB — TSH: TSH: 1.99 u[IU]/mL (ref 0.35–5.50)

## 2012-10-12 ENCOUNTER — Encounter: Payer: Self-pay | Admitting: Family Medicine

## 2012-10-12 ENCOUNTER — Ambulatory Visit: Payer: Medicare Other | Admitting: Family Medicine

## 2012-10-12 ENCOUNTER — Ambulatory Visit (INDEPENDENT_AMBULATORY_CARE_PROVIDER_SITE_OTHER): Payer: Medicare Other | Admitting: Family Medicine

## 2012-10-12 VITALS — BP 142/84 | HR 76 | Temp 98.9°F | Ht 62.75 in | Wt 159.8 lb

## 2012-10-12 DIAGNOSIS — E663 Overweight: Secondary | ICD-10-CM

## 2012-10-12 DIAGNOSIS — I889 Nonspecific lymphadenitis, unspecified: Secondary | ICD-10-CM

## 2012-10-12 DIAGNOSIS — Z889 Allergy status to unspecified drugs, medicaments and biological substances status: Secondary | ICD-10-CM

## 2012-10-12 DIAGNOSIS — Z0000 Encounter for general adult medical examination without abnormal findings: Secondary | ICD-10-CM

## 2012-10-12 DIAGNOSIS — K219 Gastro-esophageal reflux disease without esophagitis: Secondary | ICD-10-CM

## 2012-10-12 DIAGNOSIS — R030 Elevated blood-pressure reading, without diagnosis of hypertension: Secondary | ICD-10-CM

## 2012-10-12 DIAGNOSIS — M069 Rheumatoid arthritis, unspecified: Secondary | ICD-10-CM

## 2012-10-12 DIAGNOSIS — Z9109 Other allergy status, other than to drugs and biological substances: Secondary | ICD-10-CM

## 2012-10-12 DIAGNOSIS — IMO0001 Reserved for inherently not codable concepts without codable children: Secondary | ICD-10-CM

## 2012-10-12 DIAGNOSIS — M899 Disorder of bone, unspecified: Secondary | ICD-10-CM

## 2012-10-12 DIAGNOSIS — M8580 Other specified disorders of bone density and structure, unspecified site: Secondary | ICD-10-CM

## 2012-10-12 DIAGNOSIS — M949 Disorder of cartilage, unspecified: Secondary | ICD-10-CM

## 2012-10-12 NOTE — Assessment & Plan Note (Signed)
Good steady weight loss this year. Has joined the [employer] and is going multiple times each month. Continue the same

## 2012-10-12 NOTE — Assessment & Plan Note (Signed)
Well controlled today no changes 

## 2012-10-12 NOTE — Assessment & Plan Note (Signed)
Struggling with increased pain, has just had a steroid shot in her right hip this week and found it helpful. Has also had shots in her left hand in her 4th finger and right hand 3rd finger. She follows with Dr Dareen Piano at [facility]

## 2012-10-12 NOTE — Assessment & Plan Note (Signed)
Patient is going to try and dig up info on her last bone scan a couple years ago which she reports being told was normal. Old scan in her chart from 2004 was osteopenia, continue calcium, vitamin d and regular exercise

## 2012-10-12 NOTE — Assessment & Plan Note (Signed)
No c/o today may use otc meds prn

## 2012-10-12 NOTE — Assessment & Plan Note (Signed)
Consider Zyrtec daily prn

## 2012-10-12 NOTE — Progress Notes (Signed)
Patient ID: JOSHUA ZERINGUE, female   DOB: 06/01/1935, 77 y.o.   MRN: [id number] BREIONNA PUNT [id number] Nov 14, 1935 10/12/2012      Progress Note New Patient  Subjective  Chief Complaint  Chief Complaint  Patient presents with  . Follow-up    3 month    HPI  Patient is a 77 year old Caucasian female who is in today for annual exam. Overall she's doing well but she is struggling with new dentures. She had been changed last year but continues to have a lot of gum and dental pain. She notes she's been having a lot of trouble with pain 2. She headaches. Shot in her right hip last week which did help some earlier this year she's also had steroid injections in her hands and knees have been helpful as well. She follows with rheumatology for this. No recent illness or fever. No headache, chest pain, palpitations, shortness of breath, GI or GU complaints otherwise noted. No recent trips to the emergency room for medication changes.  Past Medical History  Diagnosis Date  . Hyperlipidemia   . Overweight 12/09/2010  . CATARACT, SENILE, BILATERAL 12/09/2010  . BRONCHITIS, RECURRENT 12/09/2010  . GERD 12/09/2010  . DIVERTICULAR DISEASE 12/09/2010  . Rheumatoid arthritis 12/09/2010  . LOC OSTEOARTHROS NOT SPEC PRIM/SEC UNSPEC SITE 12/09/2010  . DEGENERATIVE DISC DISEASE, AXIAL SKELETON 12/09/2010  . MEASLES, HX OF 12/09/2010  . COLONIC POLYPS, HX OF 12/09/2010  . Multiple allergies 05/22/2011  . Gastroenteritis 11/16/2011  . Disequilibrium 11/16/2011  . Carotid artery disease   . PVD (peripheral vascular disease)   . Lower extremity pain 12/31/2011  . Lymphadenitis 07/12/2012  . Elevated BP 07/12/2012  . Osteopenia 10/12/2012    Past Surgical History  Procedure Date  . Appendectomy 1953  . Cesarean section 1965    RH neg  . Polypectomy 2009    Family History  Problem Relation Age of Onset  . Alcohol abuse Mother   . Asthma Mother   . Obesity Mother   . Aneurysm Father     abdominal aortic    . Heart attack Sister   . Obesity Sister   . Hypertension Sister   . Cancer Brother     abdominal, bone  . Colon polyps Daughter   . Heart disease Paternal Grandfather   . Cancer Sister     skin, recurrent breast cancer s/p mastectomy, declined other  treatment  . Obesity Sister   . Hypertension Sister   . Stroke Brother     hemorrhagic  . Hypertension Brother   . Diabetes Brother   . Arthritis Brother   . Hypertension Brother   . Arthritis Daughter   . Hyperlipidemia Daughter   . Obesity Daughter   . Colon polyps Daughter     precancerous  . Endometriosis Daughter     History   Social History  . Marital Status: Widowed    Spouse Name: N/A    Number of Children: N/A  . Years of Education: N/A   Occupational History  . Not on file.   Social History Main Topics  . Smoking status: Never Smoker   . Smokeless tobacco: Never Used  . Alcohol Use: No  . Drug Use: No  . Sexually Active: No   Other Topics Concern  . Not on file   Social History Narrative  . No narrative on file    Current Outpatient Prescriptions on File Prior to Visit  Medication Sig Dispense Refill  . ascorbic acid (VITAMIN  C) 500 MG tablet Take 500 mg by mouth 3 (three) times daily. Vitamin E       . b complex vitamins tablet Take 1 tablet by mouth daily.        . bifidobacterium infantis (ALIGN) capsule Take 1 capsule by mouth daily.      . Calcium Carbonate (CALCIUM 600 PO) Take by mouth daily.        . Cholecalciferol (VITAMIN D3) 1000 UNITS capsule Take 1,000 Units by mouth 2 (two) times daily.       . fish oil-omega-3 fatty acids 1000 MG capsule Take 1 g by mouth 3 (three) times daily.       . Magnesium 500 MG CAPS Take 1 capsule by mouth daily.        . naproxen sodium (ANAPROX) 220 MG tablet Take 220 mg by mouth 2 (two) times daily with a meal.      . OVER THE COUNTER MEDICATION Joint Support- 1 tid      . Saline 0.65 % (SOLN) SOLN Place 2 sprays into the nose 2 (two) times daily as  needed.  1 Bottle  0  . traMADol (ULTRAM) 50 MG tablet Take 50 mg by mouth as needed.        Allergies  Allergen Reactions  . Codeine   . Esomeprazole Magnesium     Review of Systems  Review of Systems  Constitutional: Negative for fever, chills and malaise/fatigue.  HENT: Positive for congestion. Negative for hearing loss and nosebleeds.   Eyes: Negative for discharge.  Respiratory: Negative for cough, sputum production, shortness of breath and wheezing.   Cardiovascular: Negative for chest pain, palpitations and leg swelling.  Gastrointestinal: Negative for heartburn, nausea, vomiting, abdominal pain, diarrhea, constipation and blood in stool.  Genitourinary: Negative for dysuria, urgency, frequency and hematuria.  Musculoskeletal: Negative for myalgias, back pain and falls.  Skin: Negative for rash.  Neurological: Negative for dizziness, tremors, sensory change, focal weakness, loss of consciousness, weakness and headaches.  Endo/Heme/Allergies: Negative for polydipsia. Does not bruise/bleed easily.  Psychiatric/Behavioral: Negative for depression and suicidal ideas. The patient is not nervous/anxious and does not have insomnia.     Objective  BP 142/84  Pulse 76  Temp 98.9 F (37.2 C) (Temporal)  Ht 5' 2.75" (1.594 m)  Wt 159 lb 12.8 oz (72.485 kg)  BMI 28.53 kg/m2  SpO2 96%  Physical Exam  Physical Exam  Constitutional: She is oriented to person, place, and time and well-developed, well-nourished, and in no distress. No distress.  HENT:  Head: Normocephalic and atraumatic.  Right Ear: External ear normal.  Left Ear: External ear normal.  Nose: Nose normal.  Mouth/Throat: Oropharynx is clear and moist. No oropharyngeal exudate.       Upper and lower dentures  Eyes: Conjunctivae normal are normal. Pupils are equal, round, and reactive to light. Right eye exhibits no discharge. Left eye exhibits no discharge. No scleral icterus.  Neck: Normal range of motion. Neck  supple. No thyromegaly present.  Cardiovascular: Normal rate, regular rhythm, normal heart sounds and intact distal pulses.   No murmur heard. Pulmonary/Chest: Effort normal and breath sounds normal. No respiratory distress. She has no wheezes. She has no rales.  Abdominal: Soft. Bowel sounds are normal. She exhibits no distension and no mass. There is no tenderness.  Musculoskeletal: Normal range of motion. She exhibits no edema and no tenderness.  Lymphadenopathy:    She has no cervical adenopathy.  Neurological: She is alert and oriented  to person, place, and time. She has normal reflexes. No cranial nerve deficit. Coordination normal.  Skin: Skin is warm and dry. No rash noted. She is not diaphoretic.  Psychiatric: Mood, memory and affect normal.       Assessment & Plan  Rheumatoid arthritis Struggling with increased pain, has just had a steroid shot in her right hip this week and found it helpful. Has also had shots in her left hand in her 4th finger and right hand 3rd finger. She follows with Dr Dareen Piano at [facility] medical  Overweight Good steady weight loss this year. Has joined the [employer] and is going multiple times each month. Continue the same  GERD No c/o today may use otc meds prn  Lymphadenitis resolved  Elevated BP Well controlled today no changes  Osteopenia Patient is going to try and dig up info on her last bone scan a couple years ago which she reports being told was normal. Old scan in her chart from 2004 was osteopenia, continue calcium, vitamin d and regular exercise  Multiple allergies Consider Zyrtec daily prn

## 2012-10-12 NOTE — Assessment & Plan Note (Signed)
resolved 

## 2012-10-12 NOTE — Patient Instructions (Addendum)
MegaRed krill oil caps daily by [employer] can replace fish oil Try adding a Digest[employer] probiotics by [employer] Call with info on bone scan if you find it.  Cholesterol Cholesterol is a white, waxy, fat-like protein needed by your body in small amounts. The liver makes all the cholesterol you need. It is carried from the liver by the blood through the blood vessels. Deposits (plaque) may build up on blood vessel walls. This makes the arteries narrower and stiffer. Plaque increases the risk for heart attack and stroke. You cannot feel your cholesterol level even if it is very high. The only way to know is by a blood test to check your lipid (fats) levels. Once you know your cholesterol levels, you should keep a record of the test results. Work with your caregiver to to keep your levels in the desired range. WHAT THE RESULTS MEAN:  Total cholesterol is a rough measure of all the cholesterol in your blood.  LDL is the so-called bad cholesterol. This is the type that deposits cholesterol in the walls of the arteries. You want this level to be low.  HDL is the good cholesterol because it cleans the arteries and carries the LDL away. You want this level to be high.  Triglycerides are fat that the body can either burn for energy or store. High levels are closely linked to heart disease. DESIRED LEVELS:  Total cholesterol below 200.  LDL below 100 for people at risk, below 70 for very high risk.  HDL above 50 is good, above 60 is best.  Triglycerides below 150. HOW TO LOWER YOUR CHOLESTEROL:  Diet.  Choose fish or white meat chicken and [place], roasted or baked. Limit fatty cuts of red meat, fried foods, and processed meats, such as sausage and lunch meat.  Eat lots of fresh fruits and vegetables. Choose whole grains, beans, pasta, potatoes and cereals.  Use only small amounts of olive, corn or canola oils. Avoid butter, mayonnaise, shortening or palm kernel oils. Avoid foods with  trans-fats.  Use skim/nonfat milk and low-fat/nonfat yogurt and cheeses. Avoid whole milk, cream, ice cream, egg yolks and cheeses. Healthy desserts include angel food cake, ginger snaps, animal crackers, hard candy, popsicles, and low-fat/nonfat frozen yogurt. Avoid pastries, cakes, pies and cookies.  Exercise.  A regular program helps decrease LDL and raises HDL.  Helps with weight control.  Do things that increase your activity level like gardening, walking, or taking the stairs.  Medication.  May be prescribed by your caregiver to help lowering cholesterol and the risk for heart disease.  You may need medicine even if your levels are normal if you have several risk factors. HOME CARE INSTRUCTIONS   Follow your diet and exercise programs as suggested by your caregiver.  Take medications as directed.  Have blood work done when your caregiver feels it is necessary. MAKE SURE YOU:   Understand these instructions.  Will watch your condition.  Will get help right away if you are not doing well or get worse. Document Released: 08/11/2001 Document Revised: 02/08/2012 Document Reviewed: 02/01/2008 [facility] Patient Information 2013 [place], [place].

## 2012-10-31 ENCOUNTER — Telehealth: Payer: Self-pay | Admitting: Family Medicine

## 2012-10-31 ENCOUNTER — Encounter: Payer: Self-pay | Admitting: Family Medicine

## 2012-10-31 NOTE — Telephone Encounter (Signed)
Please put a note in in health maintenance regarding date of bone densitometry listed in this note and let patient know we are now waiting 3-5 years to repeat tests, so we will order it at her next visit to be done at the 3 year mark.

## 2012-10-31 NOTE — Telephone Encounter (Signed)
Patient called in to let Dr. Abner Greenspan know she found the date she had her last bone density 09/24/2010. If Dr. Abner Greenspan wants her to have another one she is okay with having it done in [place].

## 2012-10-31 NOTE — Telephone Encounter (Signed)
Pt informed and chart updated

## 2012-12-06 HISTORY — PX: CATARACT EXTRACTION: SUR2

## 2013-01-03 ENCOUNTER — Ambulatory Visit (INDEPENDENT_AMBULATORY_CARE_PROVIDER_SITE_OTHER): Payer: Medicare Other | Admitting: Family

## 2013-01-03 ENCOUNTER — Encounter: Payer: Self-pay | Admitting: Family

## 2013-01-03 VITALS — BP 122/78 | HR 80 | Temp 97.8°F | Resp 16 | Wt 168.1 lb

## 2013-01-03 DIAGNOSIS — J029 Acute pharyngitis, unspecified: Secondary | ICD-10-CM

## 2013-01-03 DIAGNOSIS — J069 Acute upper respiratory infection, unspecified: Secondary | ICD-10-CM

## 2013-01-03 LAB — POCT RAPID STREP A (OFFICE): Rapid Strep A Screen: NEGATIVE

## 2013-01-03 MED ORDER — BENZONATATE 100 MG PO CAPS: 100.0000 mg | ORAL_CAPSULE | Freq: Three times a day (TID) | ORAL | Status: AC | PRN

## 2013-01-03 NOTE — Assessment & Plan Note (Signed)
Recommended that pr call if symptoms worsen or no improve,ent in 3 days. Discussed supportive measures.

## 2013-01-03 NOTE — Progress Notes (Signed)
Subjective:    Patient ID: [name], female    DOB: 26-Jun-1935, 78 y.o.   MRN: [id number]  HPI  Cough x 2 days.  Worse at night nasal drainage.  + runny nose. Clear nasal discharge.  Has had flu shot.  Denies fever.  Mild frontal sinus headache.     Review of Systems See HPI  Past Medical History  Diagnosis Date  . Hyperlipidemia   . Overweight 12/09/2010  . CATARACT, SENILE, BILATERAL 12/09/2010  . BRONCHITIS, RECURRENT 12/09/2010  . GERD 12/09/2010  . DIVERTICULAR DISEASE 12/09/2010  . Rheumatoid arthritis 12/09/2010  . LOC OSTEOARTHROS NOT SPEC PRIM/SEC UNSPEC SITE 12/09/2010  . DEGENERATIVE DISC DISEASE, AXIAL SKELETON 12/09/2010  . MEASLES, HX OF 12/09/2010  . COLONIC POLYPS, HX OF 12/09/2010  . Multiple allergies 05/22/2011  . Gastroenteritis 11/16/2011  . Disequilibrium 11/16/2011  . Carotid artery disease   . PVD (peripheral vascular disease)   . Lower extremity pain 12/31/2011  . Lymphadenitis 07/12/2012  . Elevated BP 07/12/2012  . Osteopenia 10/12/2012    History   Social History  . Marital Status: Widowed    Spouse Name: N/A    Number of Children: N/A  . Years of Education: N/A   Occupational History  . Not on file.   Social History Main Topics  . Smoking status: Never Smoker   . Smokeless tobacco: Never Used  . Alcohol Use: No  . Drug Use: No  . Sexually Active: No   Other Topics Concern  . Not on file   Social History Narrative  . No narrative on file    Past Surgical History  Procedure Date  . Appendectomy 1953  . Cesarean section 1965    RH neg  . Polypectomy 2009  . Cataract extraction 12/06/12    right eye, laser surgery    Family History  Problem Relation Age of Onset  . Alcohol abuse Mother   . Asthma Mother   . Obesity Mother   . Aneurysm Father     abdominal aortic  . Heart attack Sister   . Obesity Sister   . Hypertension Sister   . Cancer Brother     abdominal, bone  . Colon polyps Daughter   . Heart disease Paternal  Grandfather   . Cancer Sister     skin, recurrent breast cancer s/p mastectomy, declined other  treatment  . Obesity Sister   . Hypertension Sister   . Stroke Brother     hemorrhagic  . Hypertension Brother   . Diabetes Brother   . Arthritis Brother   . Hypertension Brother   . Arthritis Daughter   . Hyperlipidemia Daughter   . Obesity Daughter   . Colon polyps Daughter     precancerous  . Endometriosis Daughter     Allergies  Allergen Reactions  . Codeine   . Esomeprazole Magnesium     Current Outpatient Prescriptions on File Prior to Visit  Medication Sig Dispense Refill  . ascorbic acid (VITAMIN C) 500 MG tablet Take 500 mg by mouth 3 (three) times daily. Vitamin E       . b complex vitamins tablet Take 1 tablet by mouth daily.        . Calcium Carbonate (CALCIUM 600 PO) Take by mouth daily.        . fish oil-omega-3 fatty acids 1000 MG capsule Take 1 g by mouth 3 (three) times daily.       Marland Kitchen OVER THE COUNTER MEDICATION Joint  Support- 1 tid      . bifidobacterium infantis (ALIGN) capsule Take 1 capsule by mouth daily.      . Cholecalciferol (VITAMIN D3) 1000 UNITS capsule Take 1,000 Units by mouth 2 (two) times daily.       . Saline 0.65 % (SOLN) SOLN Place 2 sprays into the nose 2 (two) times daily as needed.  1 Bottle  0  . traMADol (ULTRAM) 50 MG tablet Take 50 mg by mouth as needed.        BP 122/78  Pulse 80  Temp 97.8 F (36.6 C) (Oral)  Resp 16  Wt 168 lb 1.3 oz (76.241 kg)  SpO2 97%       Objective:   Physical Exam  Constitutional: She is oriented to person, place, and time. She appears well-developed and well-nourished. No distress.  HENT:  Head: Normocephalic and atraumatic.  Right Ear: Tympanic membrane and ear canal normal.  Mouth/Throat: No oropharyngeal exudate, posterior oropharyngeal edema or posterior oropharyngeal erythema.  Cardiovascular: Normal rate and regular rhythm.   No murmur heard. Pulmonary/Chest: Effort normal and breath sounds  normal. No respiratory distress. She has no wheezes.  Musculoskeletal: She exhibits no edema.  Neurological: She is alert and oriented to person, place, and time.  Skin: Skin is warm and dry.  Psychiatric: She has a normal mood and affect. Her behavior is normal. Judgment and thought content normal.          Assessment & Plan:

## 2013-01-03 NOTE — Patient Instructions (Addendum)
Call if your symptoms worsen, if you develop fever >101 or if you are not feeling better by Friday.

## 2014-12-07 DIAGNOSIS — D120 Benign neoplasm of cecum: Secondary | ICD-10-CM | POA: Diagnosis not present

## 2014-12-07 DIAGNOSIS — Z86010 Personal history of colonic polyps: Secondary | ICD-10-CM | POA: Diagnosis not present

## 2014-12-08 ENCOUNTER — Other Ambulatory Visit: Payer: Self-pay | Admitting: Orthopedic Surgery

## 2014-12-20 DIAGNOSIS — M170 Bilateral primary osteoarthritis of knee: Secondary | ICD-10-CM | POA: Diagnosis not present

## 2014-12-24 DIAGNOSIS — R05 Cough: Secondary | ICD-10-CM | POA: Diagnosis not present

## 2014-12-24 DIAGNOSIS — Z1389 Encounter for screening for other disorder: Secondary | ICD-10-CM | POA: Diagnosis not present

## 2014-12-24 DIAGNOSIS — J309 Allergic rhinitis, unspecified: Secondary | ICD-10-CM | POA: Diagnosis not present

## 2014-12-24 DIAGNOSIS — I10 Essential (primary) hypertension: Secondary | ICD-10-CM | POA: Diagnosis not present

## 2014-12-31 DIAGNOSIS — J069 Acute upper respiratory infection, unspecified: Secondary | ICD-10-CM | POA: Diagnosis not present

## 2015-01-11 ENCOUNTER — Encounter (HOSPITAL_COMMUNITY): Admission: RE | Payer: Self-pay | Source: Ambulatory Visit

## 2015-01-11 ENCOUNTER — Inpatient Hospital Stay (HOSPITAL_COMMUNITY): Admission: RE | Admit: 2015-01-11 | Payer: Self-pay | Source: Ambulatory Visit | Admitting: Orthopedic Surgery

## 2015-01-11 SURGERY — ARTHROPLASTY, KNEE, TOTAL
Anesthesia: General | Laterality: Left

## 2015-01-30 DIAGNOSIS — R21 Rash and other nonspecific skin eruption: Secondary | ICD-10-CM | POA: Diagnosis not present

## 2015-01-30 DIAGNOSIS — M150 Primary generalized (osteo)arthritis: Secondary | ICD-10-CM | POA: Diagnosis not present

## 2015-01-30 DIAGNOSIS — Z131 Encounter for screening for diabetes mellitus: Secondary | ICD-10-CM | POA: Diagnosis not present

## 2015-01-30 DIAGNOSIS — M810 Age-related osteoporosis without current pathological fracture: Secondary | ICD-10-CM | POA: Diagnosis not present

## 2015-01-30 DIAGNOSIS — E559 Vitamin D deficiency, unspecified: Secondary | ICD-10-CM | POA: Diagnosis not present

## 2015-01-30 DIAGNOSIS — E785 Hyperlipidemia, unspecified: Secondary | ICD-10-CM | POA: Diagnosis not present

## 2015-01-30 DIAGNOSIS — Z0001 Encounter for general adult medical examination with abnormal findings: Secondary | ICD-10-CM | POA: Diagnosis not present

## 2015-01-30 DIAGNOSIS — J309 Allergic rhinitis, unspecified: Secondary | ICD-10-CM | POA: Diagnosis not present

## 2015-02-21 DIAGNOSIS — M170 Bilateral primary osteoarthritis of knee: Secondary | ICD-10-CM | POA: Diagnosis not present

## 2015-03-19 DIAGNOSIS — Z13820 Encounter for screening for osteoporosis: Secondary | ICD-10-CM | POA: Diagnosis not present

## 2015-03-19 DIAGNOSIS — M8580 Other specified disorders of bone density and structure, unspecified site: Secondary | ICD-10-CM | POA: Diagnosis not present

## 2015-04-18 DIAGNOSIS — M170 Bilateral primary osteoarthritis of knee: Secondary | ICD-10-CM | POA: Diagnosis not present

## 2015-04-23 DIAGNOSIS — K5792 Diverticulitis of intestine, part unspecified, without perforation or abscess without bleeding: Secondary | ICD-10-CM | POA: Diagnosis not present

## 2015-04-23 DIAGNOSIS — Z1389 Encounter for screening for other disorder: Secondary | ICD-10-CM | POA: Diagnosis not present

## 2015-04-23 DIAGNOSIS — R350 Frequency of micturition: Secondary | ICD-10-CM | POA: Diagnosis not present

## 2015-04-23 DIAGNOSIS — R1032 Left lower quadrant pain: Secondary | ICD-10-CM | POA: Diagnosis not present

## 2015-05-16 DIAGNOSIS — M1711 Unilateral primary osteoarthritis, right knee: Secondary | ICD-10-CM | POA: Diagnosis not present

## 2015-05-16 DIAGNOSIS — M7061 Trochanteric bursitis, right hip: Secondary | ICD-10-CM | POA: Diagnosis not present

## 2015-05-16 DIAGNOSIS — M1712 Unilateral primary osteoarthritis, left knee: Secondary | ICD-10-CM | POA: Diagnosis not present

## 2015-07-01 ENCOUNTER — Other Ambulatory Visit: Payer: Self-pay | Admitting: Orthopedic Surgery

## 2015-07-09 DIAGNOSIS — K625 Hemorrhage of anus and rectum: Secondary | ICD-10-CM | POA: Diagnosis not present

## 2015-07-09 DIAGNOSIS — M179 Osteoarthritis of knee, unspecified: Secondary | ICD-10-CM | POA: Diagnosis not present

## 2015-07-09 DIAGNOSIS — M5136 Other intervertebral disc degeneration, lumbar region: Secondary | ICD-10-CM | POA: Diagnosis not present

## 2015-07-18 DIAGNOSIS — H524 Presbyopia: Secondary | ICD-10-CM | POA: Diagnosis not present

## 2015-07-18 DIAGNOSIS — H5210 Myopia, unspecified eye: Secondary | ICD-10-CM | POA: Diagnosis not present

## 2015-07-19 ENCOUNTER — Encounter (HOSPITAL_COMMUNITY): Admission: RE | Payer: Self-pay | Source: Ambulatory Visit

## 2015-07-19 ENCOUNTER — Inpatient Hospital Stay (HOSPITAL_COMMUNITY): Admission: RE | Admit: 2015-07-19 | Payer: Self-pay | Source: Ambulatory Visit | Admitting: Orthopedic Surgery

## 2015-07-19 DIAGNOSIS — M25551 Pain in right hip: Secondary | ICD-10-CM | POA: Diagnosis not present

## 2015-07-19 DIAGNOSIS — M9903 Segmental and somatic dysfunction of lumbar region: Secondary | ICD-10-CM | POA: Diagnosis not present

## 2015-07-19 DIAGNOSIS — M9905 Segmental and somatic dysfunction of pelvic region: Secondary | ICD-10-CM | POA: Diagnosis not present

## 2015-07-19 DIAGNOSIS — M5186 Other intervertebral disc disorders, lumbar region: Secondary | ICD-10-CM | POA: Diagnosis not present

## 2015-07-19 DIAGNOSIS — M25552 Pain in left hip: Secondary | ICD-10-CM | POA: Diagnosis not present

## 2015-07-19 DIAGNOSIS — M9904 Segmental and somatic dysfunction of sacral region: Secondary | ICD-10-CM | POA: Diagnosis not present

## 2015-07-19 DIAGNOSIS — M9902 Segmental and somatic dysfunction of thoracic region: Secondary | ICD-10-CM | POA: Diagnosis not present

## 2015-07-19 DIAGNOSIS — M546 Pain in thoracic spine: Secondary | ICD-10-CM | POA: Diagnosis not present

## 2015-07-19 SURGERY — ARTHROPLASTY, KNEE, TOTAL
Anesthesia: General | Laterality: Left

## 2015-07-22 DIAGNOSIS — M9903 Segmental and somatic dysfunction of lumbar region: Secondary | ICD-10-CM | POA: Diagnosis not present

## 2015-07-22 DIAGNOSIS — M546 Pain in thoracic spine: Secondary | ICD-10-CM | POA: Diagnosis not present

## 2015-07-22 DIAGNOSIS — M9905 Segmental and somatic dysfunction of pelvic region: Secondary | ICD-10-CM | POA: Diagnosis not present

## 2015-07-22 DIAGNOSIS — M25552 Pain in left hip: Secondary | ICD-10-CM | POA: Diagnosis not present

## 2015-07-22 DIAGNOSIS — M25551 Pain in right hip: Secondary | ICD-10-CM | POA: Diagnosis not present

## 2015-07-22 DIAGNOSIS — M9902 Segmental and somatic dysfunction of thoracic region: Secondary | ICD-10-CM | POA: Diagnosis not present

## 2015-07-22 DIAGNOSIS — M9904 Segmental and somatic dysfunction of sacral region: Secondary | ICD-10-CM | POA: Diagnosis not present

## 2015-07-22 DIAGNOSIS — M5186 Other intervertebral disc disorders, lumbar region: Secondary | ICD-10-CM | POA: Diagnosis not present

## 2015-07-25 DIAGNOSIS — I10 Essential (primary) hypertension: Secondary | ICD-10-CM | POA: Diagnosis not present

## 2015-07-25 DIAGNOSIS — R350 Frequency of micturition: Secondary | ICD-10-CM | POA: Diagnosis not present

## 2015-07-25 DIAGNOSIS — L989 Disorder of the skin and subcutaneous tissue, unspecified: Secondary | ICD-10-CM | POA: Diagnosis not present

## 2015-07-25 DIAGNOSIS — R05 Cough: Secondary | ICD-10-CM | POA: Diagnosis not present

## 2015-07-29 DIAGNOSIS — M9903 Segmental and somatic dysfunction of lumbar region: Secondary | ICD-10-CM | POA: Diagnosis not present

## 2015-07-29 DIAGNOSIS — M546 Pain in thoracic spine: Secondary | ICD-10-CM | POA: Diagnosis not present

## 2015-07-29 DIAGNOSIS — M5186 Other intervertebral disc disorders, lumbar region: Secondary | ICD-10-CM | POA: Diagnosis not present

## 2015-07-29 DIAGNOSIS — M25552 Pain in left hip: Secondary | ICD-10-CM | POA: Diagnosis not present

## 2015-07-29 DIAGNOSIS — M9904 Segmental and somatic dysfunction of sacral region: Secondary | ICD-10-CM | POA: Diagnosis not present

## 2015-07-29 DIAGNOSIS — M9905 Segmental and somatic dysfunction of pelvic region: Secondary | ICD-10-CM | POA: Diagnosis not present

## 2015-07-29 DIAGNOSIS — M25551 Pain in right hip: Secondary | ICD-10-CM | POA: Diagnosis not present

## 2015-07-29 DIAGNOSIS — M9902 Segmental and somatic dysfunction of thoracic region: Secondary | ICD-10-CM | POA: Diagnosis not present

## 2015-07-31 DIAGNOSIS — M25552 Pain in left hip: Secondary | ICD-10-CM | POA: Diagnosis not present

## 2015-07-31 DIAGNOSIS — M9902 Segmental and somatic dysfunction of thoracic region: Secondary | ICD-10-CM | POA: Diagnosis not present

## 2015-07-31 DIAGNOSIS — M546 Pain in thoracic spine: Secondary | ICD-10-CM | POA: Diagnosis not present

## 2015-07-31 DIAGNOSIS — M5186 Other intervertebral disc disorders, lumbar region: Secondary | ICD-10-CM | POA: Diagnosis not present

## 2015-07-31 DIAGNOSIS — M9903 Segmental and somatic dysfunction of lumbar region: Secondary | ICD-10-CM | POA: Diagnosis not present

## 2015-07-31 DIAGNOSIS — M9904 Segmental and somatic dysfunction of sacral region: Secondary | ICD-10-CM | POA: Diagnosis not present

## 2015-07-31 DIAGNOSIS — M9905 Segmental and somatic dysfunction of pelvic region: Secondary | ICD-10-CM | POA: Diagnosis not present

## 2015-07-31 DIAGNOSIS — M25551 Pain in right hip: Secondary | ICD-10-CM | POA: Diagnosis not present

## 2015-08-02 DIAGNOSIS — M25552 Pain in left hip: Secondary | ICD-10-CM | POA: Diagnosis not present

## 2015-08-02 DIAGNOSIS — M546 Pain in thoracic spine: Secondary | ICD-10-CM | POA: Diagnosis not present

## 2015-08-02 DIAGNOSIS — M5186 Other intervertebral disc disorders, lumbar region: Secondary | ICD-10-CM | POA: Diagnosis not present

## 2015-08-02 DIAGNOSIS — M25551 Pain in right hip: Secondary | ICD-10-CM | POA: Diagnosis not present

## 2015-08-02 DIAGNOSIS — M9903 Segmental and somatic dysfunction of lumbar region: Secondary | ICD-10-CM | POA: Diagnosis not present

## 2015-08-02 DIAGNOSIS — M9905 Segmental and somatic dysfunction of pelvic region: Secondary | ICD-10-CM | POA: Diagnosis not present

## 2015-08-02 DIAGNOSIS — M9904 Segmental and somatic dysfunction of sacral region: Secondary | ICD-10-CM | POA: Diagnosis not present

## 2015-08-02 DIAGNOSIS — M9902 Segmental and somatic dysfunction of thoracic region: Secondary | ICD-10-CM | POA: Diagnosis not present

## 2015-08-05 DIAGNOSIS — M9902 Segmental and somatic dysfunction of thoracic region: Secondary | ICD-10-CM | POA: Diagnosis not present

## 2015-08-05 DIAGNOSIS — M9905 Segmental and somatic dysfunction of pelvic region: Secondary | ICD-10-CM | POA: Diagnosis not present

## 2015-08-05 DIAGNOSIS — M25552 Pain in left hip: Secondary | ICD-10-CM | POA: Diagnosis not present

## 2015-08-05 DIAGNOSIS — M5186 Other intervertebral disc disorders, lumbar region: Secondary | ICD-10-CM | POA: Diagnosis not present

## 2015-08-05 DIAGNOSIS — M9904 Segmental and somatic dysfunction of sacral region: Secondary | ICD-10-CM | POA: Diagnosis not present

## 2015-08-05 DIAGNOSIS — M546 Pain in thoracic spine: Secondary | ICD-10-CM | POA: Diagnosis not present

## 2015-08-05 DIAGNOSIS — M25551 Pain in right hip: Secondary | ICD-10-CM | POA: Diagnosis not present

## 2015-08-05 DIAGNOSIS — M9903 Segmental and somatic dysfunction of lumbar region: Secondary | ICD-10-CM | POA: Diagnosis not present

## 2015-08-07 DIAGNOSIS — Z23 Encounter for immunization: Secondary | ICD-10-CM | POA: Diagnosis not present

## 2015-08-19 DIAGNOSIS — M1711 Unilateral primary osteoarthritis, right knee: Secondary | ICD-10-CM | POA: Diagnosis not present

## 2015-08-19 DIAGNOSIS — M1712 Unilateral primary osteoarthritis, left knee: Secondary | ICD-10-CM | POA: Diagnosis not present

## 2015-08-23 DIAGNOSIS — M5186 Other intervertebral disc disorders, lumbar region: Secondary | ICD-10-CM | POA: Diagnosis not present

## 2015-08-23 DIAGNOSIS — M9905 Segmental and somatic dysfunction of pelvic region: Secondary | ICD-10-CM | POA: Diagnosis not present

## 2015-08-23 DIAGNOSIS — M9904 Segmental and somatic dysfunction of sacral region: Secondary | ICD-10-CM | POA: Diagnosis not present

## 2015-08-23 DIAGNOSIS — M9903 Segmental and somatic dysfunction of lumbar region: Secondary | ICD-10-CM | POA: Diagnosis not present

## 2015-08-23 DIAGNOSIS — M9902 Segmental and somatic dysfunction of thoracic region: Secondary | ICD-10-CM | POA: Diagnosis not present

## 2015-08-23 DIAGNOSIS — M546 Pain in thoracic spine: Secondary | ICD-10-CM | POA: Diagnosis not present

## 2015-08-23 DIAGNOSIS — M25551 Pain in right hip: Secondary | ICD-10-CM | POA: Diagnosis not present

## 2015-08-23 DIAGNOSIS — M25552 Pain in left hip: Secondary | ICD-10-CM | POA: Diagnosis not present

## 2015-09-04 DIAGNOSIS — M5186 Other intervertebral disc disorders, lumbar region: Secondary | ICD-10-CM | POA: Diagnosis not present

## 2015-09-04 DIAGNOSIS — M25552 Pain in left hip: Secondary | ICD-10-CM | POA: Diagnosis not present

## 2015-09-04 DIAGNOSIS — M9902 Segmental and somatic dysfunction of thoracic region: Secondary | ICD-10-CM | POA: Diagnosis not present

## 2015-09-04 DIAGNOSIS — M9905 Segmental and somatic dysfunction of pelvic region: Secondary | ICD-10-CM | POA: Diagnosis not present

## 2015-09-04 DIAGNOSIS — M9903 Segmental and somatic dysfunction of lumbar region: Secondary | ICD-10-CM | POA: Diagnosis not present

## 2015-09-04 DIAGNOSIS — M25551 Pain in right hip: Secondary | ICD-10-CM | POA: Diagnosis not present

## 2015-09-04 DIAGNOSIS — M9904 Segmental and somatic dysfunction of sacral region: Secondary | ICD-10-CM | POA: Diagnosis not present

## 2015-09-04 DIAGNOSIS — M546 Pain in thoracic spine: Secondary | ICD-10-CM | POA: Diagnosis not present

## 2015-09-11 DIAGNOSIS — K625 Hemorrhage of anus and rectum: Secondary | ICD-10-CM | POA: Diagnosis not present

## 2015-09-19 DIAGNOSIS — M1711 Unilateral primary osteoarthritis, right knee: Secondary | ICD-10-CM | POA: Diagnosis not present

## 2015-09-19 DIAGNOSIS — M1712 Unilateral primary osteoarthritis, left knee: Secondary | ICD-10-CM | POA: Diagnosis not present

## 2015-10-04 DIAGNOSIS — M546 Pain in thoracic spine: Secondary | ICD-10-CM | POA: Diagnosis not present

## 2015-10-04 DIAGNOSIS — M9902 Segmental and somatic dysfunction of thoracic region: Secondary | ICD-10-CM | POA: Diagnosis not present

## 2015-10-04 DIAGNOSIS — M25551 Pain in right hip: Secondary | ICD-10-CM | POA: Diagnosis not present

## 2015-10-04 DIAGNOSIS — M5186 Other intervertebral disc disorders, lumbar region: Secondary | ICD-10-CM | POA: Diagnosis not present

## 2015-10-04 DIAGNOSIS — M9905 Segmental and somatic dysfunction of pelvic region: Secondary | ICD-10-CM | POA: Diagnosis not present

## 2015-10-04 DIAGNOSIS — M9904 Segmental and somatic dysfunction of sacral region: Secondary | ICD-10-CM | POA: Diagnosis not present

## 2015-10-04 DIAGNOSIS — M25552 Pain in left hip: Secondary | ICD-10-CM | POA: Diagnosis not present

## 2015-10-04 DIAGNOSIS — M9903 Segmental and somatic dysfunction of lumbar region: Secondary | ICD-10-CM | POA: Diagnosis not present

## 2015-10-17 DIAGNOSIS — M170 Bilateral primary osteoarthritis of knee: Secondary | ICD-10-CM | POA: Diagnosis not present

## 2015-11-04 DIAGNOSIS — Z01818 Encounter for other preprocedural examination: Secondary | ICD-10-CM | POA: Diagnosis not present

## 2015-11-04 DIAGNOSIS — M179 Osteoarthritis of knee, unspecified: Secondary | ICD-10-CM | POA: Diagnosis not present

## 2015-11-04 DIAGNOSIS — R9431 Abnormal electrocardiogram [ECG] [EKG]: Secondary | ICD-10-CM | POA: Diagnosis not present

## 2015-11-04 DIAGNOSIS — I10 Essential (primary) hypertension: Secondary | ICD-10-CM | POA: Diagnosis not present

## 2015-11-06 ENCOUNTER — Ambulatory Visit (INDEPENDENT_AMBULATORY_CARE_PROVIDER_SITE_OTHER): Payer: Commercial Managed Care - HMO | Admitting: Cardiology

## 2015-11-06 ENCOUNTER — Encounter: Payer: Self-pay | Admitting: Cardiology

## 2015-11-06 VITALS — BP 160/80 | HR 86 | Ht 62.5 in | Wt 162.8 lb

## 2015-11-06 DIAGNOSIS — M179 Osteoarthritis of knee, unspecified: Secondary | ICD-10-CM | POA: Insufficient documentation

## 2015-11-06 DIAGNOSIS — M1710 Unilateral primary osteoarthritis, unspecified knee: Secondary | ICD-10-CM | POA: Diagnosis not present

## 2015-11-06 DIAGNOSIS — R9431 Abnormal electrocardiogram [ECG] [EKG]: Secondary | ICD-10-CM

## 2015-11-06 DIAGNOSIS — Z01810 Encounter for preprocedural cardiovascular examination: Secondary | ICD-10-CM | POA: Insufficient documentation

## 2015-11-06 DIAGNOSIS — R030 Elevated blood-pressure reading, without diagnosis of hypertension: Secondary | ICD-10-CM | POA: Diagnosis not present

## 2015-11-06 DIAGNOSIS — IMO0001 Reserved for inherently not codable concepts without codable children: Secondary | ICD-10-CM

## 2015-11-06 NOTE — Patient Instructions (Signed)
Medication Instructions:  Your physician recommends that you continue on your current medications as directed. Please refer to the Current Medication list given to you today.   Labwork: None  Testing/Procedures: Your physician has requested that you have an echocardiogram. Echocardiography is a painless test that uses sound waves to create images of your heart. It provides your doctor with information about the size and shape of your heart and how well your heart's chambers and valves are working. This procedure takes approximately one hour. There are no restrictions for this procedure.  Your physician has requested that you have a lexiscan myoview. For further information please visit [email or web address]. Please follow instruction sheet, as given.  Follow-Up: Your physician recommends that you schedule a follow-up appointment AS NEEDED with Dr. Radford Pax pending your study results.  Any Other Special Instructions Will Be Listed Below (If Applicable).     If you need a refill on your cardiac medications before your next appointment, please call your pharmacy.

## 2015-11-06 NOTE — Progress Notes (Signed)
Cardiology Office Note   Date:  11/06/2015   ID:  Treneice, Kahre 06/16/1935, MRN [id number]  PCP:  Martinique, BETTY G, MD    Chief Complaint  Patient presents with  . Abnormal ECG      History of Present Illness: [name] is a 80 y.o. female who presents for preoperative cardiac clearance for left total knee replacement.  She has a history of dyslipidemia, carotid artery disease and HTN.  She denies any chest pain, LE edema, dizziness, palpitations or syncope.  She does get DOE when going up stairs and had been exercising up to a year ago without any problems but now cannot exercise due to knee pain.  She was found on recent preop w/u to have an abnormal EKG and is now referred for further evaluation.  She cannot exercise due to OA.  She has never smoked. She has a family history of CAD with a sister who had an MI in her 71's but was a smoker.  She has another sister who died at 54 of CHF.  EKG showed NSR with nonspecific T wave inversions in V1-V3 and III and avF.    Past Medical History  Diagnosis Date  . Hyperlipidemia   . Overweight(278.02) 12/09/2010  . CATARACT, SENILE, BILATERAL 12/09/2010  . BRONCHITIS, RECURRENT 12/09/2010  . GERD 12/09/2010  . DIVERTICULAR DISEASE 12/09/2010  . Rheumatoid arthritis(714.0) 12/09/2010  . LOC OSTEOARTHROS NOT SPEC PRIM/SEC UNSPEC SITE 12/09/2010  . DEGENERATIVE DISC DISEASE, AXIAL SKELETON 12/09/2010  . MEASLES, HX OF 12/09/2010  . COLONIC POLYPS, HX OF 12/09/2010  . Multiple allergies 05/22/2011  . Gastroenteritis 11/16/2011  . Disequilibrium 11/16/2011  . Carotid artery disease ([place])   . PVD (peripheral vascular disease) ([place])   . Lower extremity pain 12/31/2011  . Lymphadenitis 07/12/2012  . Elevated BP 07/12/2012  . Osteopenia 10/12/2012  . Abnormal EKG   . Fibromyalgia   . DDD (degenerative disc disease), lumbar     Past Surgical History  Procedure Laterality Date  . Appendectomy  1953  . Cesarean section   1965    RH neg  . Polypectomy  2009  . Cataract extraction  12/06/12    right eye, laser surgery  . Tubal ligation       Current Outpatient Prescriptions  Medication Sig Dispense Refill  . ascorbic acid (VITAMIN C) 500 MG tablet Take 500 mg by mouth 3 (three) times daily. Vitamin E     . b complex vitamins tablet Take 1 tablet by mouth daily.      . B Complex-C (SUPER B COMPLEX PO) Take by mouth daily.    . Calcium Carbonate (CALCIUM 600 PO) Take 300 mg by mouth 3 (three) times daily.     . Coenzyme Q10 (CO Q-10) 100 MG CAPS Take 100 mg by mouth daily.    . fish oil-omega-3 fatty acids 1000 MG capsule Take 1 g by mouth daily.     Marland Kitchen Hyaluronic Acid-Vitamin C (HYALURONIC ACID PO) Take 2 tablets by mouth daily.    Marland Kitchen loratadine (CLARITIN) 10 MG tablet Take 10 mg by mouth daily.    . Magnesium 250 MG TABS Take 1 tablet by mouth 2 (two) times daily.     . milk thistle 175 MG tablet Take 175 mg by mouth 2 (two) times daily.    . Multiple Vitamins-Minerals (MULTIVITAMIN WITH MINERALS) tablet Take 1 tablet  by mouth daily.    Marland Kitchen OVER THE COUNTER MEDICATION Joint Support- 1 tid     No current facility-administered medications for this visit.    Allergies:   Codeine; Naproxen; Esomeprazole magnesium; Statins; and Welchol    Social History:  The patient  reports that she has never smoked. She has never used smokeless tobacco. She reports that she does not drink alcohol or use illicit drugs.   Family History:  The patient's family history includes Alcohol abuse in her mother; Aneurysm in her father; Arthritis in her brother and daughter; Asthma in her mother; Cancer in her brother and sister; Colon polyps in her daughter and daughter; Diabetes in her brother; Endometriosis in her daughter; Heart attack in her sister; Heart disease in her paternal grandfather; Hyperlipidemia in her daughter; Hypertension in her brother, brother, sister, and sister; Obesity in her daughter, mother, sister, and sister;  Stroke in her brother.    ROS:  Please see the history of present illness.   Otherwise, review of systems are positive for none.   All other systems are reviewed and negative.    PHYSICAL EXAM: VS:  BP 160/80 mmHg  Pulse 86  Ht 5' 2.5" (1.588 m)  Wt 162 lb 12.8 oz (73.846 kg)  BMI 29.28 kg/m2  SpO2 98% , BMI Body mass index is 29.28 kg/(m^2). GEN: Well nourished, well developed, in no acute distress HEENT: normal Neck: no JVD, carotid bruits, or masses Cardiac: RRR; no murmurs, rubs, or gallops,no edema  Respiratory:  clear to auscultation bilaterally, normal work of breathing GI: soft, nontender, nondistended, + BS MS: no deformity or atrophy Skin: warm and dry, no rash Neuro:  Strength and sensation are intact Psych: euthymic mood, full affect   EKG:  EKG is not ordered today.    Recent Labs: No results found for requested labs within last 365 days.    Lipid Panel    Component Value Date/Time   CHOL 242* 10/06/2012 0809   TRIG 140.0 10/06/2012 0809   HDL 46.90 10/06/2012 0809   CHOLHDL 5 10/06/2012 0809   VLDL 28.0 10/06/2012 0809   LDLDIRECT 161.2 10/06/2012 0809      Wt Readings from Last 3 Encounters:  11/06/15 162 lb 12.8 oz (73.846 kg)  01/03/13 168 lb 1.3 oz (76.241 kg)  10/12/12 159 lb 12.8 oz (72.485 kg)      ASSESSMENT AND PLAN:  1.  Left knee pain needing preoperative cardiac clearance for left TKR. 2.  Abnormal EKG with nonspecific T wave abnormality in V1-V3 and III and aVF.  She cannot exercise due to knee pain so cannot get a good assessment for exertional symptoms.  Will get a Lexiscan myoview to rule out ischemia.  Check 2D echo to assess LVF. 3. Elevated BP but she says that she gets very nervous when taking her BP at the office.  At home her BP runs 130/70's   Current medicines are reviewed at length with the patient today.  The patient does not have concerns regarding medicines.  The following changes have been made:  no  change  Labs/ tests ordered today: See above Assessment and Plan No orders of the defined types were placed in this encounter.     Disposition:   FU with PRN pending results of stress test   SignedSueanne Margarita, MD  11/06/2015 11:46 AM    [facility] [address], [place], [place]  [address] Phone: [phone]; Fax: [phone]

## 2015-11-18 ENCOUNTER — Telehealth (HOSPITAL_COMMUNITY): Payer: Self-pay | Admitting: *Deleted

## 2015-11-18 NOTE — Telephone Encounter (Signed)
.  Attempted to call regarding upcoming appointment- no answer. Hubbard Robinson, RN

## 2015-11-19 ENCOUNTER — Telehealth (HOSPITAL_COMMUNITY): Payer: Self-pay | Admitting: *Deleted

## 2015-11-19 NOTE — Telephone Encounter (Signed)
Left message on voicemail in reference to upcoming appointment scheduled for 11/21/15. Phone number given for a call back so details instructions can be given. Annastacia Duba, Ranae Palms

## 2015-11-19 NOTE — Telephone Encounter (Signed)
Patient given detailed instructions per Myocardial Perfusion Study Information Sheet for the test on 11/21/15 at 1000. Patient notified to arrive 15 minutes early and that it is imperative to arrive on time for appointment to keep from having the test rescheduled.  If you need to cancel or reschedule your appointment, please call the office within 24 hours of your appointment. Failure to do so may result in a cancellation of your appointment, and a $50 no show fee. Patient verbalized understanding.[name], Ranae Palms

## 2015-11-21 ENCOUNTER — Other Ambulatory Visit: Payer: Self-pay

## 2015-11-21 ENCOUNTER — Ambulatory Visit (HOSPITAL_BASED_OUTPATIENT_CLINIC_OR_DEPARTMENT_OTHER): Payer: Commercial Managed Care - HMO

## 2015-11-21 ENCOUNTER — Ambulatory Visit (HOSPITAL_COMMUNITY): Payer: Commercial Managed Care - HMO | Attending: Cardiovascular Disease

## 2015-11-21 DIAGNOSIS — I10 Essential (primary) hypertension: Secondary | ICD-10-CM | POA: Diagnosis not present

## 2015-11-21 DIAGNOSIS — E785 Hyperlipidemia, unspecified: Secondary | ICD-10-CM | POA: Diagnosis not present

## 2015-11-21 DIAGNOSIS — R9431 Abnormal electrocardiogram [ECG] [EKG]: Secondary | ICD-10-CM | POA: Insufficient documentation

## 2015-11-21 DIAGNOSIS — I779 Disorder of arteries and arterioles, unspecified: Secondary | ICD-10-CM | POA: Insufficient documentation

## 2015-11-21 DIAGNOSIS — Z01810 Encounter for preprocedural cardiovascular examination: Secondary | ICD-10-CM

## 2015-11-21 DIAGNOSIS — R0600 Dyspnea, unspecified: Secondary | ICD-10-CM | POA: Insufficient documentation

## 2015-11-21 DIAGNOSIS — I739 Peripheral vascular disease, unspecified: Secondary | ICD-10-CM | POA: Insufficient documentation

## 2015-11-21 LAB — MYOCARDIAL PERFUSION IMAGING
CHL CUP NUCLEAR SDS: 4
CHL CUP RESTING HR STRESS: 70 {beats}/min
LVDIAVOL: 68 mL
LVSYSVOL: 19 mL
Peak HR: 96 {beats}/min
RATE: 0.34
SRS: 1
SSS: 5
TID: 0.96

## 2015-11-21 MED ORDER — REGADENOSON 0.4 MG/5ML IV SOLN
0.4000 mg | Freq: Once | INTRAVENOUS | Status: AC
  Administered 2015-11-21: 0.4 mg via INTRAVENOUS

## 2015-11-21 MED ORDER — TECHNETIUM TC 99M SESTAMIBI GENERIC - CARDIOLITE
10.2000 | Freq: Once | INTRAVENOUS | Status: AC | PRN
  Administered 2015-11-21: 10 via INTRAVENOUS

## 2015-11-21 MED ORDER — TECHNETIUM TC 99M SESTAMIBI GENERIC - CARDIOLITE
32.9000 | Freq: Once | INTRAVENOUS | Status: AC | PRN
  Administered 2015-11-21: 32.9 via INTRAVENOUS

## 2015-11-27 ENCOUNTER — Telehealth: Payer: Self-pay | Admitting: Cardiology

## 2015-11-27 NOTE — Telephone Encounter (Signed)
Informed patient that results were routed to Dr. Doug Sou in-basket 12/22 but will be sent again per her request. Results faxed as well.

## 2015-11-27 NOTE — Telephone Encounter (Signed)
New message     Patient calling wants to know why her primary care office hasn't receive the test results from  12.22.2016    Dr. Betty  Martinique phone [phone].

## 2015-12-04 ENCOUNTER — Other Ambulatory Visit: Payer: Self-pay | Admitting: Orthopedic Surgery

## 2015-12-23 ENCOUNTER — Encounter (HOSPITAL_COMMUNITY): Payer: Self-pay

## 2015-12-23 ENCOUNTER — Encounter (HOSPITAL_COMMUNITY)
Admission: RE | Admit: 2015-12-23 | Discharge: 2015-12-23 | Disposition: A | Discharge status: Home / Self Care | Payer: Commercial Managed Care - HMO | Source: Ambulatory Visit | Attending: Orthopedic Surgery | Admitting: Orthopedic Surgery

## 2015-12-23 DIAGNOSIS — M1990 Unspecified osteoarthritis, unspecified site: Secondary | ICD-10-CM | POA: Diagnosis not present

## 2015-12-23 DIAGNOSIS — Z0183 Encounter for blood typing: Secondary | ICD-10-CM | POA: Insufficient documentation

## 2015-12-23 DIAGNOSIS — Z01812 Encounter for preprocedural laboratory examination: Secondary | ICD-10-CM | POA: Diagnosis not present

## 2015-12-23 DIAGNOSIS — J449 Chronic obstructive pulmonary disease, unspecified: Secondary | ICD-10-CM | POA: Diagnosis not present

## 2015-12-23 DIAGNOSIS — Z01818 Encounter for other preprocedural examination: Secondary | ICD-10-CM | POA: Diagnosis not present

## 2015-12-23 DIAGNOSIS — E785 Hyperlipidemia, unspecified: Secondary | ICD-10-CM | POA: Diagnosis not present

## 2015-12-23 DIAGNOSIS — K219 Gastro-esophageal reflux disease without esophagitis: Secondary | ICD-10-CM | POA: Insufficient documentation

## 2015-12-23 DIAGNOSIS — M069 Rheumatoid arthritis, unspecified: Secondary | ICD-10-CM | POA: Insufficient documentation

## 2015-12-23 HISTORY — DX: Frequency of micturition: R35.0

## 2015-12-23 HISTORY — DX: Nocturia: R35.1

## 2015-12-23 HISTORY — DX: Unspecified hemorrhoids: K64.9

## 2015-12-23 HISTORY — DX: Dizziness and giddiness: R42

## 2015-12-23 HISTORY — DX: Personal history of colon polyps, unspecified: Z86.0100

## 2015-12-23 HISTORY — DX: Personal history of other diseases of the respiratory system: Z87.09

## 2015-12-23 HISTORY — DX: Unspecified osteoarthritis, unspecified site: M19.90

## 2015-12-23 HISTORY — DX: Dorsalgia, unspecified: M54.9

## 2015-12-23 HISTORY — DX: Pain in unspecified joint: M25.50

## 2015-12-23 HISTORY — DX: Age-related osteoporosis without current pathological fracture: M81.0

## 2015-12-23 HISTORY — DX: Anesthesia of skin: R20.0

## 2015-12-23 HISTORY — DX: Pneumonia, unspecified organism: J18.9

## 2015-12-23 HISTORY — DX: Gastritis, unspecified, without bleeding: K29.70

## 2015-12-23 HISTORY — DX: Personal history of colonic polyps: Z86.010

## 2015-12-23 HISTORY — DX: Diverticulosis of intestine, part unspecified, without perforation or abscess without bleeding: K57.90

## 2015-12-23 LAB — CBC WITH DIFFERENTIAL/PLATELET
BASOS ABS: 0 10*3/uL (ref 0.0–0.1)
Basophils Relative: 0 %
EOS ABS: 0.2 10*3/uL (ref 0.0–0.7)
EOS PCT: 3 %
HCT: 39.4 % (ref 36.0–46.0)
Hemoglobin: 13.3 g/dL (ref 12.0–15.0)
LYMPHS ABS: 2.3 10*3/uL (ref 0.7–4.0)
LYMPHS PCT: 32 %
MCH: 32 pg (ref 26.0–34.0)
MCHC: 33.8 g/dL (ref 30.0–36.0)
MCV: 94.9 fL (ref 78.0–100.0)
MONO ABS: 0.5 10*3/uL (ref 0.1–1.0)
Monocytes Relative: 7 %
Neutro Abs: 4.1 10*3/uL (ref 1.7–7.7)
Neutrophils Relative %: 58 %
PLATELETS: 274 10*3/uL (ref 150–400)
RBC: 4.15 MIL/uL (ref 3.87–5.11)
RDW: 13.5 % (ref 11.5–15.5)
WBC: 7.2 10*3/uL (ref 4.0–10.5)

## 2015-12-23 LAB — URINALYSIS, ROUTINE W REFLEX MICROSCOPIC
Bilirubin Urine: NEGATIVE
GLUCOSE, UA: NEGATIVE mg/dL
Hgb urine dipstick: NEGATIVE
KETONES UR: NEGATIVE mg/dL
Nitrite: NEGATIVE
PROTEIN: NEGATIVE mg/dL
Specific Gravity, Urine: 1.01 (ref 1.005–1.030)
pH: 7.5 (ref 5.0–8.0)

## 2015-12-23 LAB — PROTIME-INR
INR: 0.98 (ref 0.00–1.49)
PROTHROMBIN TIME: 13.2 s (ref 11.6–15.2)

## 2015-12-23 LAB — URINE MICROSCOPIC-ADD ON

## 2015-12-23 LAB — SURGICAL PCR SCREEN
MRSA, PCR: NEGATIVE
Staphylococcus aureus: NEGATIVE

## 2015-12-23 LAB — APTT: APTT: 33 s (ref 24–37)

## 2015-12-23 MED ORDER — POVIDONE-IODINE 7.5 % EX SOLN: Freq: Once | CUTANEOUS | Status: DC

## 2015-12-23 NOTE — Pre-Procedure Instructions (Signed)
[name]  12/23/2015      [id number] [place], [address] [place] [address] Phone: [phone] Fax: [phone]  [facility] [place] [address] [place] [address] Phone: [phone] Fax: [phone]  [facility] [place], [address] [place] [address] Phone: [phone] Fax: [phone]    Your procedure is scheduled on Fri, Feb 3 @ 10:00 AM  Report to [facility] Admitting at 8:00 AM  Call this number if you have problems the morning of surgery:  [phone]   Remember:  Do not eat food or drink liquids after midnight.  Take these medicines the morning of surgery with A SIP OF WATER Claritin(Loratadine)             A week prior to surgery stop taking Fish Oil,CO Q 10,Vitamins or Herbal Medications. No Goody's,BC's,Aleve,Aspirin,Advil,or Motrin.    Do not wear jewelry, make-up or nail polish.  Do not wear lotions, powders, or perfumes.  You may wear deodorant.  Do not shave 48 hours prior to surgery.   Do not bring valuables to the hospital.  [facility] is not responsible for any belongings or valuables.  Contacts, dentures or bridgework may not be worn into surgery.  Leave your suitcase in the car.  After surgery it may be brought to your room.  For patients admitted to the hospital, discharge time will be determined by your treatment team.  Patients discharged the day of surgery will not be allowed to drive home.    Special instructions:  [facility] - Preparing for Surgery  Before surgery, you can play an important role.  Because skin is not sterile, your skin needs to be as free of germs as possible.  You can reduce the number of germs on you skin by washing with CHG (chlorahexidine gluconate) soap before surgery.  CHG is an antiseptic cleaner which kills germs and bonds with the  skin to continue killing germs even after washing.  Please DO NOT use if you have an allergy to CHG or antibacterial soaps.  If your skin becomes reddened/irritated stop using the CHG and inform your nurse when you arrive at Short Stay.  Do not shave (including legs and underarms) for at least 48 hours prior to the first CHG shower.  You may shave your face.  Please follow these instructions carefully:   1.  Shower with CHG Soap the night before surgery and the                                morning of Surgery.  2.  If you choose to wash your hair, wash your hair first as usual with your       normal shampoo.  3.  After you shampoo, rinse your hair and body thoroughly to remove the                      Shampoo.  4.  Use CHG as you would any other liquid soap.  You can apply chg directly       to the skin and wash gently with scrungie or a clean washcloth.  5.  Apply the CHG Soap to your body ONLY FROM THE NECK DOWN.  Do not use on open wounds or open sores.  Avoid contact with your eyes,       ears, mouth and genitals (private parts).  Wash genitals (private parts)       with your normal soap.  6.  Wash thoroughly, paying special attention to the area where your surgery        will be performed.  7.  Thoroughly rinse your body with warm water from the neck down.  8.  DO NOT shower/wash with your normal soap after using and rinsing off       the CHG Soap.  9.  Pat yourself dry with a clean towel.            10.  Wear clean pajamas.            11.  Place clean sheets on your bed the night of your first shower and do not        sleep with pets.  Day of Surgery  Do not apply any lotions/deoderants the morning of surgery.  Please wear clean clothes to the hospital/surgery center.    Please read over the following fact sheets that you were given. Pain Booklet, Coughing and Deep Breathing, Blood Transfusion Information, MRSA Information and Surgical Site Infection Prevention

## 2015-12-23 NOTE — Progress Notes (Addendum)
Cardiologist is Dr.Traci Turner with last visit in epic from Dec 2016  [place] is Dr.Betty Martinique  Echo report in epic from 11-21-15  Stress test report in epic from 11-21-15  Heart cath denies ever having one  EKG in epic from 11-04-15  CXR denies in past yr

## 2015-12-24 NOTE — Progress Notes (Signed)
Anesthesia Chart Review:  Pt is 81 year old female scheduled for L total knee arthroplasty on 01/03/2016 with Dr. Berenice Primas.   PMH includes:  Hyperlipidemia, RA, GERD. Never smoker. BMI 30.  Preoperative labs reviewed.  BMET not performed at PAT, will need to be obtained DOS.   Chest x-ray 12/23/15 reviewed. COPD. Evidence of previous granulomatous infection. There is no active cardiopulmonary disease.  EKG 11/04/15: Sinus rhythm. Nonspecific ST depression, nonspecific T abnormality  Nuclear stress test 11/21/15: Normal stress nuclear study with no ischemia or infarction; EF 72 with normal wall motion.  Echo 11/21/15:  - Left ventricle: The cavity size was normal. Systolic function was normal. The estimated ejection fraction was in the range of 55% to 60%. Wall motion was normal; there were no regional wall motion abnormalities. Left ventricular diastolic function parameters were normal.  Pt saw Dr. Fransico Him with cardiology for pre-op eval 11/06/15. Dr. Radford Pax cleared pt for surgery in comment on stress test results.   If BMET acceptable DOS, I anticipate pt can proceed as scheduled.   Willeen Cass, FNP-BC [facility] Stay Surgical Center/Anesthesiology Phone: [phone] 12/24/2015 3:51 PM

## 2016-01-02 NOTE — H&P (Signed)
TOTAL KNEE ADMISSION H&P  Patient is being admitted for left total knee arthroplasty.  Subjective:  Chief Complaint:left knee pain.  HPI: [name], 81 y.o. female, has a history of pain and functional disability in the left knee due to arthritis and has failed non-surgical conservative treatments for greater than 12 weeks to includeNSAID's and/or analgesics, corticosteriod injections, viscosupplementation injections, use of assistive devices, weight reduction as appropriate and activity modification.  Onset of symptoms was gradual, starting 5 years ago with gradually worsening course since that time. The patient noted no past surgery on the left knee(s).  Patient currently rates pain in the left knee(s) at 6 out of 10 with activity. Patient has night pain, worsening of pain with activity and weight bearing, pain that interferes with activities of daily living, pain with passive range of motion, crepitus and joint swelling.  Patient has evidence of subchondral cysts, subchondral sclerosis, periarticular osteophytes, joint subluxation and joint space narrowing by imaging studies. This patient has had failure of all reasonable conservative care. There is no active infection.  Patient Active Problem List   Diagnosis Date Noted  . Abnormal EKG 11/06/2015  . DJD (degenerative joint disease) of knee 11/06/2015  . Preoperative cardiovascular examination 11/06/2015  . URI (upper respiratory infection) 01/03/2013  . Osteopenia 10/12/2012  . Lymphadenitis 07/12/2012  . Elevated BP 07/12/2012  . Lower extremity pain 12/31/2011  . Carotid artery disease ([place])   . PVD (peripheral vascular disease) ([place])   . Disequilibrium 11/16/2011  . CTS (carpal tunnel syndrome) 08/31/2011  . Multiple allergies 05/22/2011  . MIXED HYPERLIPIDEMIA 12/09/2010  . Overweight(278.02) 12/09/2010  . CATARACT, SENILE, BILATERAL 12/09/2010  . BRONCHITIS, RECURRENT 12/09/2010  . GERD 12/09/2010  . DIVERTICULAR DISEASE  12/09/2010  . Rheumatoid arthritis(714.0) 12/09/2010  . LOC OSTEOARTHROS NOT SPEC PRIM/SEC UNSPEC SITE 12/09/2010  . Cass DISEASE, AXIAL SKELETON 12/09/2010  . MEASLES, HX OF 12/09/2010  . COLONIC POLYPS, HX OF 12/09/2010  . GASTRITIS, HX OF 12/09/2010  . CHICKENPOX, HX OF 12/09/2010   Past Medical History  Diagnosis Date  . Hyperlipidemia     doesn't take any meds  . Rheumatoid arthritis(714.0) 12/09/2010  . LOC OSTEOARTHROS NOT SPEC PRIM/SEC UNSPEC SITE 12/09/2010  . DEGENERATIVE DISC DISEASE, AXIAL SKELETON 12/09/2010  . Fibromyalgia   . DDD (degenerative disc disease), lumbar   . History of bronchitis     several yrs ago  . Pneumonia     hx of-many yrs ago  . Vertigo     rarely  . Numbness     right pointer and middle finger.Left middle  . Osteoarthritis   . Joint pain   . Back pain     DDD  . Osteoporosis   . GERD     not on any meds  . Gastritis   . History of colon polyps     precancerous  . Diverticulosis   . Urinary frequency   . Nocturia   . Hemorrhoids     internal    Past Surgical History  Procedure Laterality Date  . Cesarean section  1965    RH neg  . Cataract extraction  12/06/12    right eye, laser surgery  . Tubal ligation    . Appendectomy      at age 34  . Colonoscopy      No prescriptions prior to admission   Allergies  Allergen Reactions  . Codeine   . Naproxen Hypertension  . Esomeprazole Magnesium Other (See Comments)  Abdominal pain  . Statins Other (See Comments)    Myalgia and stomach pain  . Welchol [Colesevelam Hcl] Other (See Comments)    Myalgia and constipation    Social History  Substance Use Topics  . Smoking status: Never Smoker   . Smokeless tobacco: Never Used  . Alcohol Use: No    Family History  Problem Relation Age of Onset  . Alcohol abuse Mother   . Asthma Mother   . Obesity Mother   . Aneurysm Father     abdominal aortic  . Heart attack Sister   . Obesity Sister   . Hypertension Sister    . Cancer Brother     abdominal, bone  . Colon polyps Daughter   . Heart disease Paternal Grandfather   . Cancer Sister     skin, recurrent breast cancer s/p mastectomy, declined other  treatment  . Obesity Sister   . Hypertension Sister   . Stroke Brother     hemorrhagic  . Hypertension Brother   . Diabetes Brother   . Arthritis Brother   . Hypertension Brother   . Arthritis Daughter   . Hyperlipidemia Daughter   . Obesity Daughter   . Colon polyps Daughter     precancerous  . Endometriosis Daughter      ROS ROS: I have reviewed the patient's review of systems thoroughly and there are no positive responses as relates to the HPI. Objective:  Physical Exam  Vital signs in last 24 hours:    Well-developed well-nourished patient in no acute distress. Alert and oriented x3 HEENT:within normal limits Cardiac: Regular rate and rhythm Pulmonary: Lungs clear to auscultation Abdomen: Soft and nontender.  Normal active bowel sounds  Musculoskeletal: (left knee: Painful range of motion.  Trace effusion.  No instability.  Limited range of motion 5-105.  Neurovascularly intact distally.  Labs: Recent Results (from the past 2160 hour(s))  Myocardial Perfusion Imaging     Status: None   Collection Time: 11/21/15  1:29 PM  Result Value Ref Range   Rest HR 70 bpm   Rest BP 168/81 mmHg   Exercise duration (min)  min   Exercise duration (sec)  sec   Estimated workload  METS   Peak HR 96 bpm   Peak BP 159/65 mmHg   MPHR  bpm   Percent HR  %   RPE     LV Systolic Volume 19 mL   TID 123456    LV Diastolic Volume 68 mL   LHR 0.34    SSS 5    SRS 1    SDS 4   Urinalysis, Routine w reflex microscopic (not at [facility])     Status: Abnormal   Collection Time: 12/23/15  9:44 AM  Result Value Ref Range   Color, Urine YELLOW YELLOW   APPearance CLEAR CLEAR   Specific Gravity, Urine 1.010 1.005 - 1.030   pH 7.5 5.0 - 8.0   Glucose, UA NEGATIVE NEGATIVE mg/dL   Hgb urine dipstick  NEGATIVE NEGATIVE   Bilirubin Urine NEGATIVE NEGATIVE   Ketones, ur NEGATIVE NEGATIVE mg/dL   Protein, ur NEGATIVE NEGATIVE mg/dL   Nitrite NEGATIVE NEGATIVE   Leukocytes, UA TRACE (A) NEGATIVE  Surgical pcr screen     Status: None   Collection Time: 12/23/15  9:44 AM  Result Value Ref Range   MRSA, PCR NEGATIVE NEGATIVE   Staphylococcus aureus NEGATIVE NEGATIVE    Comment:        The Xpert SA Assay (  FDA approved for NASAL specimens in patients over 56 years of age), is one component of a comprehensive surveillance program.  Test performance has been validated by [facility] for patients greater than or equal to 45 year old. It is not intended to diagnose infection nor to guide or monitor treatment.   Urine microscopic-add on     Status: Abnormal   Collection Time: 12/23/15  9:44 AM  Result Value Ref Range   Squamous Epithelial / LPF 0-5 (A) NONE SEEN   WBC, UA 0-5 0 - 5 WBC/hpf   RBC / HPF 0-5 0 - 5 RBC/hpf   Bacteria, UA RARE (A) NONE SEEN   Casts HYALINE CASTS (A) NEGATIVE  APTT     Status: None   Collection Time: 12/23/15  9:45 AM  Result Value Ref Range   aPTT 33 24 - 37 seconds  CBC WITH DIFFERENTIAL     Status: None   Collection Time: 12/23/15  9:45 AM  Result Value Ref Range   WBC 7.2 4.0 - 10.5 K/uL   RBC 4.15 3.87 - 5.11 MIL/uL   Hemoglobin 13.3 12.0 - 15.0 g/dL   HCT 39.4 36.0 - 46.0 %   MCV 94.9 78.0 - 100.0 fL   MCH 32.0 26.0 - 34.0 pg   MCHC 33.8 30.0 - 36.0 g/dL   RDW 13.5 11.5 - 15.5 %   Platelets 274 150 - 400 K/uL   Neutrophils Relative % 58 %   Neutro Abs 4.1 1.7 - 7.7 K/uL   Lymphocytes Relative 32 %   Lymphs Abs 2.3 0.7 - 4.0 K/uL   Monocytes Relative 7 %   Monocytes Absolute 0.5 0.1 - 1.0 K/uL   Eosinophils Relative 3 %   Eosinophils Absolute 0.2 0.0 - 0.7 K/uL   Basophils Relative 0 %   Basophils Absolute 0.0 0.0 - 0.1 K/uL  Protime-INR     Status: None   Collection Time: 12/23/15  9:45 AM  Result Value Ref Range   Prothrombin Time  13.2 11.6 - 15.2 seconds   INR 0.98 0.00 - 1.49  Type and screen Order type and screen if day of surgery is less than 15 days from draw of preadmission visit or order morning of surgery if day of surgery is greater than 6 days from preadmission visit.     Status: None (Preliminary result)   Collection Time: 12/23/15  9:57 AM  Result Value Ref Range   ABO/RH(D) O NEG    Antibody Screen POS    Sample Expiration 01/06/2016    Extend sample reason NO TRANSFUSIONS OR PREGNANCY IN THE PAST 3 MONTHS    DAT, IgG NEG    Antibody Identification ANTI C ANTI D ANTI FYA (Duffy a)    PT AG Type      NEGATIVE FOR C ANTIGEN NEGATIVE FOR DUFFY A ANTIGEN   Unit Number [id number]    Blood Component Type RED CELLS,LR    Unit division 00    Status of Unit ALLOCATED    Transfusion Status OK TO TRANSFUSE    Crossmatch Result COMPATIBLE    Donor AG Type      NEGATIVE FOR C ANTIGEN NEGATIVE FOR DUFFY A ANTIGEN   Unit Number [id number]    Blood Component Type RBC LR PHER2    Unit division 00    Status of Unit ALLOCATED    Transfusion Status OK TO TRANSFUSE    Crossmatch Result COMPATIBLE    Donor AG Type  NEGATIVE FOR C ANTIGEN NEGATIVE FOR DUFFY A ANTIGEN    Estimated body mass index is 29.62 kg/(m^2) as calculated from the following:   Height as of 11/21/15: 5\' 2"  (1.575 m).   Weight as of 11/21/15: 73.483 kg (162 lb).   Imaging Review Plain radiographs demonstrate severe degenerative joint disease of the left knee(s). The overall alignment ismild varus. The bone quality appears to be fair for age and reported activity level.  Assessment/Plan:  End stage arthritis, right knee   The patient history, physical examination, clinical judgment of the provider and imaging studies are consistent with end stage degenerative joint disease of the right knee(s) and total knee arthroplasty is deemed medically necessary. The treatment options including medical management, injection therapy arthroscopy  and arthroplasty were discussed at length. The risks and benefits of total knee arthroplasty were presented and reviewed. The risks due to aseptic loosening, infection, stiffness, patella tracking problems, thromboembolic complications and other imponderables were discussed. The patient acknowledged the explanation, agreed to proceed with the plan and consent was signed. Patient is being admitted for inpatient treatment for surgery, pain control, PT, OT, prophylactic antibiotics, VTE prophylaxis, progressive ambulation and ADL's and discharge planning. The patient is planning to be discharged home with home health services

## 2016-01-03 ENCOUNTER — Encounter (HOSPITAL_COMMUNITY)
Admission: RE | Disposition: A | Discharge status: Skilled Nursing Facility | Payer: Self-pay | Source: Ambulatory Visit | Attending: Orthopedic Surgery

## 2016-01-03 ENCOUNTER — Inpatient Hospital Stay (HOSPITAL_COMMUNITY): Payer: Commercial Managed Care - HMO | Admitting: Anesthesiology

## 2016-01-03 ENCOUNTER — Encounter (HOSPITAL_COMMUNITY): Payer: Self-pay | Admitting: Anesthesiology

## 2016-01-03 ENCOUNTER — Inpatient Hospital Stay (HOSPITAL_COMMUNITY): Payer: Commercial Managed Care - HMO | Admitting: Emergency Medicine

## 2016-01-03 ENCOUNTER — Inpatient Hospital Stay (HOSPITAL_COMMUNITY)
Admission: RE | Admit: 2016-01-03 | Discharge: 2016-01-06 | DRG: 470 | Disposition: A | Discharge status: Skilled Nursing Facility | Payer: Commercial Managed Care - HMO | Source: Ambulatory Visit | Attending: Orthopedic Surgery | Admitting: Orthopedic Surgery

## 2016-01-03 DIAGNOSIS — M1711 Unilateral primary osteoarthritis, right knee: Secondary | ICD-10-CM | POA: Diagnosis not present

## 2016-01-03 DIAGNOSIS — Z471 Aftercare following joint replacement surgery: Secondary | ICD-10-CM | POA: Diagnosis not present

## 2016-01-03 DIAGNOSIS — Z825 Family history of asthma and other chronic lower respiratory diseases: Secondary | ICD-10-CM

## 2016-01-03 DIAGNOSIS — Z809 Family history of malignant neoplasm, unspecified: Secondary | ICD-10-CM

## 2016-01-03 DIAGNOSIS — Z833 Family history of diabetes mellitus: Secondary | ICD-10-CM | POA: Diagnosis not present

## 2016-01-03 DIAGNOSIS — M549 Dorsalgia, unspecified: Secondary | ICD-10-CM | POA: Diagnosis not present

## 2016-01-03 DIAGNOSIS — Z8249 Family history of ischemic heart disease and other diseases of the circulatory system: Secondary | ICD-10-CM | POA: Diagnosis not present

## 2016-01-03 DIAGNOSIS — M179 Osteoarthritis of knee, unspecified: Secondary | ICD-10-CM | POA: Diagnosis not present

## 2016-01-03 DIAGNOSIS — R262 Difficulty in walking, not elsewhere classified: Secondary | ICD-10-CM | POA: Diagnosis not present

## 2016-01-03 DIAGNOSIS — M1712 Unilateral primary osteoarthritis, left knee: Secondary | ICD-10-CM | POA: Diagnosis present

## 2016-01-03 DIAGNOSIS — Z8261 Family history of arthritis: Secondary | ICD-10-CM

## 2016-01-03 DIAGNOSIS — M1990 Unspecified osteoarthritis, unspecified site: Secondary | ICD-10-CM | POA: Diagnosis not present

## 2016-01-03 DIAGNOSIS — M810 Age-related osteoporosis without current pathological fracture: Secondary | ICD-10-CM | POA: Diagnosis present

## 2016-01-03 DIAGNOSIS — M797 Fibromyalgia: Secondary | ICD-10-CM | POA: Diagnosis not present

## 2016-01-03 DIAGNOSIS — K219 Gastro-esophageal reflux disease without esophagitis: Secondary | ICD-10-CM | POA: Diagnosis not present

## 2016-01-03 DIAGNOSIS — M6281 Muscle weakness (generalized): Secondary | ICD-10-CM | POA: Diagnosis not present

## 2016-01-03 DIAGNOSIS — E782 Mixed hyperlipidemia: Secondary | ICD-10-CM | POA: Diagnosis not present

## 2016-01-03 DIAGNOSIS — Z823 Family history of stroke: Secondary | ICD-10-CM | POA: Diagnosis not present

## 2016-01-03 DIAGNOSIS — M069 Rheumatoid arthritis, unspecified: Secondary | ICD-10-CM | POA: Diagnosis present

## 2016-01-03 DIAGNOSIS — R278 Other lack of coordination: Secondary | ICD-10-CM | POA: Diagnosis not present

## 2016-01-03 DIAGNOSIS — Z86010 Personal history of colonic polyps: Secondary | ICD-10-CM

## 2016-01-03 DIAGNOSIS — Z811 Family history of alcohol abuse and dependence: Secondary | ICD-10-CM

## 2016-01-03 DIAGNOSIS — M25662 Stiffness of left knee, not elsewhere classified: Secondary | ICD-10-CM | POA: Diagnosis not present

## 2016-01-03 DIAGNOSIS — Z96652 Presence of left artificial knee joint: Secondary | ICD-10-CM | POA: Diagnosis not present

## 2016-01-03 DIAGNOSIS — M25562 Pain in left knee: Secondary | ICD-10-CM | POA: Diagnosis present

## 2016-01-03 DIAGNOSIS — M170 Bilateral primary osteoarthritis of knee: Secondary | ICD-10-CM | POA: Diagnosis not present

## 2016-01-03 DIAGNOSIS — I739 Peripheral vascular disease, unspecified: Secondary | ICD-10-CM | POA: Diagnosis not present

## 2016-01-03 HISTORY — PX: TOTAL KNEE ARTHROPLASTY: SHX125

## 2016-01-03 LAB — BASIC METABOLIC PANEL
ANION GAP: 9 (ref 5–15)
BUN: 14 mg/dL (ref 6–20)
CO2: 24 mmol/L (ref 22–32)
Calcium: 9.7 mg/dL (ref 8.9–10.3)
Chloride: 107 mmol/L (ref 101–111)
Creatinine, Ser: 0.94 mg/dL (ref 0.44–1.00)
GFR, EST NON AFRICAN AMERICAN: 57 mL/min — AB (ref >60)
GLUCOSE: 101 mg/dL — AB (ref 65–99)
POTASSIUM: 4.7 mmol/L (ref 3.5–5.1)
Sodium: 140 mmol/L (ref 135–145)

## 2016-01-03 LAB — TYPE AND SCREEN
ABO/RH(D): O NEG
ANTIBODY SCREEN: POSITIVE

## 2016-01-03 SURGERY — ARTHROPLASTY, KNEE, TOTAL
Anesthesia: Spinal | Site: Knee | Laterality: Left

## 2016-01-03 MED ORDER — BUPIVACAINE LIPOSOME 1.3 % IJ SUSP
20.0000 mL | Freq: Once | INTRAMUSCULAR | Status: DC
  Filled 2016-01-03: qty 20

## 2016-01-03 MED ORDER — DEXAMETHASONE SODIUM PHOSPHATE 10 MG/ML IJ SOLN
10.0000 mg | Freq: Once | INTRAMUSCULAR | Status: AC
  Administered 2016-01-04: 10 mg via INTRAVENOUS
  Filled 2016-01-03: qty 1

## 2016-01-03 MED ORDER — ACETAMINOPHEN 325 MG PO TABS
650.0000 mg | ORAL_TABLET | Freq: Four times a day (QID) | ORAL | Status: DC | PRN
  Administered 2016-01-04: 650 mg via ORAL
  Filled 2016-01-03: qty 2

## 2016-01-03 MED ORDER — ACETAMINOPHEN 650 MG RE SUPP: 650.0000 mg | Freq: Four times a day (QID) | RECTAL | Status: DC | PRN

## 2016-01-03 MED ORDER — ASPIRIN EC 325 MG PO TBEC
325.0000 mg | DELAYED_RELEASE_TABLET | Freq: Two times a day (BID) | ORAL | Status: DC
  Administered 2016-01-03 – 2016-01-06 (×6): 325 mg via ORAL
  Filled 2016-01-03 (×6): qty 1

## 2016-01-03 MED ORDER — KETOROLAC TROMETHAMINE 15 MG/ML IJ SOLN
7.5000 mg | Freq: Three times a day (TID) | INTRAMUSCULAR | Status: AC
  Administered 2016-01-03 – 2016-01-04 (×3): 7.5 mg via INTRAVENOUS
  Filled 2016-01-03 (×3): qty 1

## 2016-01-03 MED ORDER — ONDANSETRON HCL 4 MG/2ML IJ SOLN: 4.0000 mg | Freq: Four times a day (QID) | INTRAMUSCULAR | Status: DC | PRN

## 2016-01-03 MED ORDER — FENTANYL CITRATE (PF) 100 MCG/2ML IJ SOLN
INTRAMUSCULAR | Status: AC
  Filled 2016-01-03: qty 2

## 2016-01-03 MED ORDER — LORATADINE 10 MG PO TABS: 10.0000 mg | ORAL_TABLET | Freq: Every day | ORAL | Status: DC | PRN

## 2016-01-03 MED ORDER — BACLOFEN 10 MG PO TABS: 10.0000 mg | ORAL_TABLET | Freq: Three times a day (TID) | ORAL | Status: AC | PRN

## 2016-01-03 MED ORDER — LIDOCAINE HCL (CARDIAC) 20 MG/ML IV SOLN
INTRAVENOUS | Status: AC
  Filled 2016-01-03: qty 5

## 2016-01-03 MED ORDER — MIDAZOLAM HCL 2 MG/2ML IJ SOLN
INTRAMUSCULAR | Status: AC
  Filled 2016-01-03: qty 2

## 2016-01-03 MED ORDER — PROPOFOL 10 MG/ML IV BOLUS
INTRAVENOUS | Status: AC
  Filled 2016-01-03: qty 20

## 2016-01-03 MED ORDER — ROCURONIUM BROMIDE 50 MG/5ML IV SOLN
INTRAVENOUS | Status: AC
  Filled 2016-01-03: qty 1

## 2016-01-03 MED ORDER — RISAQUAD PO CAPS
1.0000 | ORAL_CAPSULE | Freq: Every evening | ORAL | Status: DC
  Administered 2016-01-03 – 2016-01-05 (×3): 1 via ORAL
  Filled 2016-01-03 (×4): qty 1

## 2016-01-03 MED ORDER — ZOLPIDEM TARTRATE 5 MG PO TABS
5.0000 mg | ORAL_TABLET | Freq: Every evening | ORAL | Status: DC | PRN
  Administered 2016-01-04: 5 mg via ORAL
  Filled 2016-01-03 (×2): qty 1

## 2016-01-03 MED ORDER — OXYCODONE HCL 5 MG PO TABS
ORAL_TABLET | ORAL | Status: AC
  Filled 2016-01-03: qty 1

## 2016-01-03 MED ORDER — MAGNESIUM CITRATE PO SOLN: 1.0000 | Freq: Once | ORAL | Status: DC | PRN

## 2016-01-03 MED ORDER — PROMETHAZINE HCL 25 MG/ML IJ SOLN: 6.2500 mg | INTRAMUSCULAR | Status: DC | PRN

## 2016-01-03 MED ORDER — FENTANYL CITRATE (PF) 100 MCG/2ML IJ SOLN
25.0000 ug | INTRAMUSCULAR | Status: DC | PRN
  Administered 2016-01-03 (×2): 50 ug via INTRAVENOUS

## 2016-01-03 MED ORDER — ACIDOPHILUS PO CAPS: 1.0000 | ORAL_CAPSULE | Freq: Every evening | ORAL | Status: DC

## 2016-01-03 MED ORDER — BUPIVACAINE LIPOSOME 1.3 % IJ SUSP
INTRAMUSCULAR | Status: DC | PRN
  Administered 2016-01-03: 20 mL

## 2016-01-03 MED ORDER — HYDROMORPHONE HCL 1 MG/ML IJ SOLN
0.5000 mg | INTRAMUSCULAR | Status: DC | PRN
  Administered 2016-01-03: 1 mg via INTRAVENOUS
  Administered 2016-01-05: 0.5 mg via INTRAVENOUS
  Filled 2016-01-03 (×2): qty 1

## 2016-01-03 MED ORDER — OXYCODONE-ACETAMINOPHEN 5-325 MG PO TABS: 1.0000 | ORAL_TABLET | Freq: Four times a day (QID) | ORAL | Status: DC | PRN

## 2016-01-03 MED ORDER — ONDANSETRON HCL 4 MG PO TABS: 4.0000 mg | ORAL_TABLET | Freq: Four times a day (QID) | ORAL | Status: DC | PRN

## 2016-01-03 MED ORDER — SENNOSIDES-DOCUSATE SODIUM 8.6-50 MG PO TABS
1.0000 | ORAL_TABLET | Freq: Every evening | ORAL | Status: DC | PRN
  Administered 2016-01-04 – 2016-01-05 (×2): 1 via ORAL
  Filled 2016-01-03 (×2): qty 1

## 2016-01-03 MED ORDER — METHOCARBAMOL 1000 MG/10ML IJ SOLN
500.0000 mg | Freq: Four times a day (QID) | INTRAVENOUS | Status: DC | PRN
  Administered 2016-01-03: 500 mg via INTRAVENOUS
  Filled 2016-01-03 (×2): qty 5

## 2016-01-03 MED ORDER — BISACODYL 5 MG PO TBEC: 5.0000 mg | DELAYED_RELEASE_TABLET | Freq: Every day | ORAL | Status: DC | PRN

## 2016-01-03 MED ORDER — PROPOFOL 500 MG/50ML IV EMUL
INTRAVENOUS | Status: DC | PRN
  Administered 2016-01-03: 75 ug/kg/min via INTRAVENOUS

## 2016-01-03 MED ORDER — 0.9 % SODIUM CHLORIDE (POUR BTL) OPTIME
TOPICAL | Status: DC | PRN
  Administered 2016-01-03: 1000 mL

## 2016-01-03 MED ORDER — BUPIVACAINE HCL (PF) 0.5 % IJ SOLN
INTRAMUSCULAR | Status: DC | PRN
Start: 2016-01-03 — End: 2016-01-03
  Administered 2016-01-03: 20 mL

## 2016-01-03 MED ORDER — CEFAZOLIN SODIUM-DEXTROSE 2-3 GM-% IV SOLR
2.0000 g | Freq: Four times a day (QID) | INTRAVENOUS | Status: AC
  Administered 2016-01-03 – 2016-01-04 (×2): 2 g via INTRAVENOUS
  Filled 2016-01-03 (×2): qty 50

## 2016-01-03 MED ORDER — METHOCARBAMOL 500 MG PO TABS
500.0000 mg | ORAL_TABLET | Freq: Four times a day (QID) | ORAL | Status: DC | PRN
  Administered 2016-01-04 – 2016-01-05 (×3): 500 mg via ORAL
  Filled 2016-01-03 (×3): qty 1

## 2016-01-03 MED ORDER — BUPIVACAINE IN DEXTROSE 0.75-8.25 % IT SOLN
INTRATHECAL | Status: DC | PRN
  Administered 2016-01-03: 1.8 mL via INTRATHECAL

## 2016-01-03 MED ORDER — ONDANSETRON HCL 4 MG/2ML IJ SOLN
INTRAMUSCULAR | Status: DC | PRN
  Administered 2016-01-03: 4 mg via INTRAVENOUS

## 2016-01-03 MED ORDER — FENTANYL CITRATE (PF) 100 MCG/2ML IJ SOLN
INTRAMUSCULAR | Status: DC | PRN
  Administered 2016-01-03: 50 ug via INTRAVENOUS

## 2016-01-03 MED ORDER — BUPIVACAINE HCL (PF) 0.5 % IJ SOLN
INTRAMUSCULAR | Status: AC
  Filled 2016-01-03: qty 30

## 2016-01-03 MED ORDER — MIDAZOLAM HCL 5 MG/5ML IJ SOLN
INTRAMUSCULAR | Status: DC | PRN
  Administered 2016-01-03: 2 mg via INTRAVENOUS

## 2016-01-03 MED ORDER — ALUM & MAG HYDROXIDE-SIMETH 200-200-20 MG/5ML PO SUSP: 30.0000 mL | ORAL | Status: DC | PRN

## 2016-01-03 MED ORDER — PROPOFOL 10 MG/ML IV BOLUS: INTRAVENOUS | Status: DC | PRN

## 2016-01-03 MED ORDER — TRANEXAMIC ACID 1000 MG/10ML IV SOLN
2000.0000 mg | Freq: Once | INTRAVENOUS | Status: DC
Start: 2016-01-03 — End: 2016-01-03
  Filled 2016-01-03: qty 20

## 2016-01-03 MED ORDER — ONDANSETRON HCL 4 MG/2ML IJ SOLN
INTRAMUSCULAR | Status: AC
  Filled 2016-01-03: qty 2

## 2016-01-03 MED ORDER — DIPHENHYDRAMINE HCL 12.5 MG/5ML PO ELIX: 12.5000 mg | ORAL_SOLUTION | ORAL | Status: DC | PRN

## 2016-01-03 MED ORDER — TRANEXAMIC ACID 1000 MG/10ML IV SOLN
1000.0000 mg | INTRAVENOUS | Status: AC
  Administered 2016-01-03: 1000 mg via INTRAVENOUS
  Filled 2016-01-03: qty 10

## 2016-01-03 MED ORDER — ASPIRIN EC 325 MG PO TBEC: 325.0000 mg | DELAYED_RELEASE_TABLET | Freq: Two times a day (BID) | ORAL | Status: AC

## 2016-01-03 MED ORDER — DOCUSATE SODIUM 100 MG PO CAPS
100.0000 mg | ORAL_CAPSULE | Freq: Two times a day (BID) | ORAL | Status: DC
  Administered 2016-01-03 – 2016-01-06 (×7): 100 mg via ORAL
  Filled 2016-01-03 (×7): qty 1

## 2016-01-03 MED ORDER — LACTATED RINGERS IV SOLN
INTRAVENOUS | Status: DC
  Administered 2016-01-03: 10:00:00 via INTRAVENOUS

## 2016-01-03 MED ORDER — SODIUM CHLORIDE 0.9 % IV SOLN
INTRAVENOUS | Status: DC
  Administered 2016-01-03: 14:00:00 via INTRAVENOUS

## 2016-01-03 MED ORDER — SODIUM CHLORIDE 0.9 % IV SOLN
10.0000 mg | INTRAVENOUS | Status: DC | PRN
  Administered 2016-01-03: 10 ug/min via INTRAVENOUS

## 2016-01-03 MED ORDER — OXYCODONE HCL 5 MG PO TABS
5.0000 mg | ORAL_TABLET | ORAL | Status: DC | PRN
  Administered 2016-01-03: 10 mg via ORAL
  Administered 2016-01-03: 5 mg via ORAL
  Administered 2016-01-04 – 2016-01-06 (×8): 10 mg via ORAL
  Filled 2016-01-03 (×9): qty 2

## 2016-01-03 MED ORDER — CEFAZOLIN SODIUM-DEXTROSE 2-3 GM-% IV SOLR
2.0000 g | INTRAVENOUS | Status: AC
  Administered 2016-01-03: 2 g via INTRAVENOUS
  Filled 2016-01-03: qty 50

## 2016-01-03 MED ORDER — FENTANYL CITRATE (PF) 250 MCG/5ML IJ SOLN
INTRAMUSCULAR | Status: AC
  Filled 2016-01-03: qty 5

## 2016-01-03 SURGICAL SUPPLY — 67 items
APL SKNCLS STERI-STRIP NONHPOA (GAUZE/BANDAGES/DRESSINGS) ×1
BANDAGE ESMARK 6X9 LF (GAUZE/BANDAGES/DRESSINGS) ×1 IMPLANT
BENZOIN TINCTURE PRP APPL 2/3 (GAUZE/BANDAGES/DRESSINGS) ×3 IMPLANT
BLADE SAGITTAL 25.0X1.19X90 (BLADE) ×2 IMPLANT
BLADE SAGITTAL 25.0X1.19X90MM (BLADE) ×1
BLADE SAW SAG 90X13X1.27 (BLADE) ×3 IMPLANT
BNDG CMPR 9X6 STRL LF SNTH (GAUZE/BANDAGES/DRESSINGS) ×1
BNDG ESMARK 6X9 LF (GAUZE/BANDAGES/DRESSINGS) ×3
BOWL SMART MIX CTS (DISPOSABLE) ×3 IMPLANT
CAP KNEE TOTAL 3 SIGMA ×2 IMPLANT
CEMENT HV SMART SET (Cement) ×6 IMPLANT
CLOSURE WOUND 1/2 X4 (GAUZE/BANDAGES/DRESSINGS) ×2
COVER SURGICAL LIGHT HANDLE (MISCELLANEOUS) ×3 IMPLANT
CUFF TOURNIQUET SINGLE 34IN LL (TOURNIQUET CUFF) ×3 IMPLANT
CUFF TOURNIQUET SINGLE 44IN (TOURNIQUET CUFF) IMPLANT
DRAPE EXTREMITY T 121X128X90 (DRAPE) ×3 IMPLANT
DRAPE IMP U-DRAPE 54X76 (DRAPES) ×3 IMPLANT
DRAPE U-SHAPE 47X51 STRL (DRAPES) ×3 IMPLANT
DRSG AQUACEL AG ADV 3.5X10 (GAUZE/BANDAGES/DRESSINGS) ×2 IMPLANT
DRSG MEPILEX BORDER 4X12 (GAUZE/BANDAGES/DRESSINGS) ×3 IMPLANT
DRSG PAD ABDOMINAL 8X10 ST (GAUZE/BANDAGES/DRESSINGS) ×3 IMPLANT
DURAPREP 26ML APPLICATOR (WOUND CARE) ×3 IMPLANT
ELECT REM PT RETURN 9FT ADLT (ELECTROSURGICAL) ×3
ELECTRODE REM PT RTRN 9FT ADLT (ELECTROSURGICAL) ×1 IMPLANT
EVACUATOR 1/8 PVC DRAIN (DRAIN) ×3 IMPLANT
FACESHIELD WRAPAROUND (MASK) ×3 IMPLANT
FACESHIELD WRAPAROUND OR TEAM (MASK) ×1 IMPLANT
GAUZE SPONGE 4X4 12PLY STRL (GAUZE/BANDAGES/DRESSINGS) ×3 IMPLANT
GLOVE BIOGEL PI IND STRL 8 (GLOVE) ×2 IMPLANT
GLOVE BIOGEL PI INDICATOR 8 (GLOVE) ×4
GLOVE ECLIPSE 7.5 STRL STRAW (GLOVE) ×6 IMPLANT
GOWN STRL REUS W/ TWL LRG LVL3 (GOWN DISPOSABLE) ×1 IMPLANT
GOWN STRL REUS W/ TWL XL LVL3 (GOWN DISPOSABLE) ×2 IMPLANT
GOWN STRL REUS W/TWL LRG LVL3 (GOWN DISPOSABLE) ×3
GOWN STRL REUS W/TWL XL LVL3 (GOWN DISPOSABLE) ×6
HANDPIECE INTERPULSE COAX TIP (DISPOSABLE) ×3
HOOD PEEL AWAY FACE SHEILD DIS (HOOD) ×9 IMPLANT
IMMOBILIZER KNEE 20 (SOFTGOODS) IMPLANT
IMMOBILIZER KNEE 22 UNIV (SOFTGOODS) ×3 IMPLANT
KIT BASIN OR (CUSTOM PROCEDURE TRAY) ×3 IMPLANT
KIT ROOM TURNOVER OR (KITS) ×3 IMPLANT
MANIFOLD NEPTUNE II (INSTRUMENTS) ×3 IMPLANT
NDL SPNL 22GX3.5 QUINCKE BK (NEEDLE) ×1 IMPLANT
NEEDLE SPNL 22GX3.5 QUINCKE BK (NEEDLE) ×3 IMPLANT
NS IRRIG 1000ML POUR BTL (IV SOLUTION) ×3 IMPLANT
PACK TOTAL JOINT (CUSTOM PROCEDURE TRAY) ×3 IMPLANT
PACK UNIVERSAL I (CUSTOM PROCEDURE TRAY) ×3 IMPLANT
PAD ARMBOARD 7.5X6 YLW CONV (MISCELLANEOUS) ×6 IMPLANT
PAD CAST 4YDX4 CTTN HI CHSV (CAST SUPPLIES) ×1 IMPLANT
PADDING CAST ABS 4INX4YD NS (CAST SUPPLIES) ×2
PADDING CAST ABS COTTON 4X4 ST (CAST SUPPLIES) IMPLANT
PADDING CAST COTTON 4X4 STRL (CAST SUPPLIES) ×3
SET HNDPC FAN SPRY TIP SCT (DISPOSABLE) ×1 IMPLANT
STAPLER VISISTAT 35W (STAPLE) IMPLANT
STRIP CLOSURE SKIN 1/2X4 (GAUZE/BANDAGES/DRESSINGS) ×4 IMPLANT
SUCTION FRAZIER HANDLE 10FR (MISCELLANEOUS) ×2
SUCTION TUBE FRAZIER 10FR DISP (MISCELLANEOUS) ×1 IMPLANT
SUT MNCRL AB 3-0 PS2 18 (SUTURE) IMPLANT
SUT VIC AB 0 CTB1 27 (SUTURE) ×6 IMPLANT
SUT VIC AB 1 CT1 27 (SUTURE) ×6
SUT VIC AB 1 CT1 27XBRD ANBCTR (SUTURE) ×2 IMPLANT
SUT VIC AB 2-0 CTB1 (SUTURE) ×6 IMPLANT
SYR 50ML LL SCALE MARK (SYRINGE) ×3 IMPLANT
TOWEL OR 17X24 6PK STRL BLUE (TOWEL DISPOSABLE) ×3 IMPLANT
TOWEL OR 17X26 10 PK STRL BLUE (TOWEL DISPOSABLE) ×3 IMPLANT
TRAY FOLEY CATH 16FRSI W/METER (SET/KITS/TRAYS/PACK) IMPLANT
WRAP KNEE MAXI GEL POST OP (GAUZE/BANDAGES/DRESSINGS) ×3 IMPLANT

## 2016-01-03 NOTE — Progress Notes (Signed)
Utilization review completed.  

## 2016-01-03 NOTE — Op Note (Signed)
[name], [name] NO.:  [id number]  MEDICAL RECORD NO.:  [id number]  LOCATION:  [id number]                        FACILITY:  [facility]  PHYSICIAN:  Alta Corning, M.D.   DATE OF BIRTH:  1935-08-11  DATE OF PROCEDURE:  01/03/2016 DATE OF DISCHARGE:                              OPERATIVE REPORT   PREOPERATIVE DIAGNOSIS:  End-stage degenerative joint disease, bilateral knees.  POSTOPERATIVE DIAGNOSIS:  End-stage degenerative joint disease, bilateral knees.  PROCEDURE:  Left total knee replacement with a Sigma system, size 3 femur, size 3 tibia, 10 mm bridging bearing, and a 35 mm all- polyethylene patella.  SURGEON:  Alta Corning, M.D.  ASSISTANT:  Gary Fleet, P.A.  ANESTHESIA:  Spinal.  BRIEF HISTORY:  [name] is a 81 year old female, with a long history and significant complaints of bilateral knee pain.  We treated conservatively with anti-inflammatory medication, activity modification, injection therapy, and after failure of all these conservative treatments.  She was taken to the operating room for left total knee replacement.  A preoperative x-ray showed end-stage degenerative joint disease with bone-on-bone change.  She was having night pain and light activity pain.  DESCRIPTION OF PROCEDURE:  The patient was taken to the operative room. After adequate anesthesia was obtained with general anesthetic, the patient was placed supine on the operating room table.  Left leg was prepped and draped in usual sterile fashion.  Following this, the leg was exsanguinated.  Blood pressure tourniquet inflated to 300 mmHg. Following this, a midline incision was made in subcutaneous tissue down to the level of extensor mechanism.  Medial parapatellar arthrotomy was undertaken.  The medial and lateral meniscus was removed, retropatellar fat pad, synovium on the anterior aspect of the femur, and anterior and posterior cruciates.  Once this was done, attention  was turned to the femur when intramedullary pilot hole was drilled and an 11 mm of distal bone resected with a 5-degree valgus inclination.  Anterior and posterior cuts were made, chamfers and box, where after measuring for 3 and once this was done, attention was then turned to the tibia.  It is sized to a 3, it has drilled and keeled and the proximal cut on the tibia was made with an extramedullary alignment guide.  Once this was done, a spacer block could be put in place and easily gets to full extension.  At this point, attention was turned towards the placement of the trial components.  A size 3 tibia was placed with a size 3 femur and a 10 mm bridging bearing trial.  Attention was turned to the patella, it was resected down to a level of 13 mm and a 35 paddle was chosen.  Lugs were drilled in the pad and the patella was placed.  Knee put through a range of motion, excellent stability and range of motion were achieved. At this time, attention turned towards to the knee, where the trials were removed.  The knee was copiously and thoroughly lavaged, pulsatile lavage, irrigation, and suctioned dry.  Attention was then turned towards, after drying the knee, the final components were cemented in place size 3 tibia, size 3 femur, 10 mm retrograde  trial was placed and a 35 all poly patella was placed and held with a clamp.  Once this was completed, the cements all allowed to harden and all excess bone cement was removed.  The tourniquet was let down.  All bleeding was controlled with electrocautery, 40 mL of 20 mL Exparel and 20 mL Marcaine were instilled, throughout the synovial reflections for postoperative anesthesia.  Once this was completed, the medial parapatellar closed with a 1 Vicryl running over a medium Hemovac drain and the skin was closed with 0 and 2-0 Vicryl, and skin staples.  Sterile compressive dressing was applied.  The patient was taken to the recovery room to be in  satisfactory condition.  Estimated loss of procedure is minimal. Estimated tourniquet time was 53 minutes.  The final tourniquet time can be gotten from her anesthetic record.     Alta Corning, M.D.     [name]  D:  01/03/2016  T:  01/03/2016  Job:  [id number]

## 2016-01-03 NOTE — Anesthesia Postprocedure Evaluation (Signed)
Anesthesia Post Note  Patient: [name]  Procedure(s) Performed: Procedure(s) (LRB): LEFT TOTAL KNEE ARTHROPLASTY (Left)  Anesthesia Type: General Level of consciousness: awake Pain management: pain level controlled Vital Signs Assessment: post-procedure vital signs reviewed and stable Respiratory status: spontaneous breathing Cardiovascular status: stable Anesthetic complications: no    Last Vitals:  Filed Vitals:   01/03/16 1400 01/03/16 1414  BP: 153/78 153/99  Pulse: 64 68  Temp:  35.6 C  Resp: 12 10    Last Pain:  Filed Vitals:   01/03/16 1419  PainSc: Asleep                 EDWARDS,Alicia Seib

## 2016-01-03 NOTE — Evaluation (Signed)
Physical Therapy Evaluation Patient Details Name: RAYDEN TURRO MRN: [id number] DOB: July 15, 1935 Today's Date: 01/03/2016   History of Present Illness  81 y.o. female admitted to [facility] on 01/03/16 for elective L TKA.  Pt with significant PMHx of RA, DDD, fibromyalgia, vertigo, and urinary frequency.    Clinical Impression  Pt is POD #0 and is moving well, min assist to do short distance, in room ambulation to the chair with RW.  She is planning on d/c to SNF [facility]) prior to returning home with her daughter's intermittent assist.   PT to follow acutely for deficits listed below.       Follow Up Recommendations SNF (Planning on [facility])    Equipment Recommendations  Rolling walker with 5" wheels;3in1 (PT)    Recommendations for Other Services   NA    Precautions / Restrictions Precautions Precautions: Knee Precaution Booklet Issued: Yes (comment) Precaution Comments: knee exercise handout given Required Braces or Orthoses: Knee Immobilizer - Left Knee Immobilizer - Left: On when out of bed or walking Restrictions Weight Bearing Restrictions: Yes LLE Weight Bearing: Weight bearing as tolerated      Mobility  Bed Mobility Overal bed mobility: Needs Assistance Bed Mobility: Supine to Sit     Supine to sit: Min assist;HOB elevated     General bed mobility comments: Min assist to help progress left leg over EOB after donning KI in supine.  Verbal cues for hand palcement and sequencing.   Transfers Overall transfer level: Needs assistance Equipment used: Rolling walker (2 wheeled) Transfers: Sit to/from Stand Sit to Stand: Min assist         General transfer comment: Min assist to support trunk during transition.  Verbal cues for safe hand placement.   Ambulation/Gait Ambulation/Gait assistance: Min assist Ambulation Distance (Feet): 8 Feet Assistive device: Rolling walker (2 wheeled) Gait Pattern/deviations: Step-to pattern;Antalgic     General Gait  Details: Pt with moderately antalgic gait pattern assist for balance and to help unweight left leg when going to step forward with the right leg.  Verbal cues for safest LE sequencing and to breathe.           Balance Overall balance assessment: Needs assistance Sitting-balance support: Feet supported;No upper extremity supported Sitting balance-Leahy Scale: Good     Standing balance support: Bilateral upper extremity supported Standing balance-Leahy Scale: Good                               Pertinent Vitals/Pain Pain Assessment: 0-10 Pain Score: 5  Pain Location: left knee Pain Descriptors / Indicators: Aching;Burning Pain Intervention(s): Limited activity within patient's tolerance;Monitored during session;Repositioned    Home Living Family/patient expects to be discharged to:: Other (Comment) ([facility] for rehab) Living Arrangements: Children Available Help at Discharge: Family;Available PRN/intermittently (daughter babysits at the house Mon/tues) Type of Home: House Home Access: Stairs to enter Entrance Stairs-Rails: Psychiatric nurse of Steps: 4-5 Home Layout: One level Home Equipment: Cane - single point;Grab bars - tub/shower      Prior Function Level of Independence: Independent with assistive device(s)         Comments: used cane as needed     Hand Dominance   Dominant Hand: Right    Extremity/Trunk Assessment   Upper Extremity Assessment: Defer to OT evaluation           Lower Extremity Assessment: LLE deficits/detail   LLE Deficits / Details: left leg with  normal post op pain and weakness.  Ankle at least 3/5, knee 2+/5, hip 2/5 per gross bed level assessment.   Cervical / Trunk Assessment: Other exceptions  Communication   Communication: No difficulties  Cognition Arousal/Alertness: Awake/alert Behavior During Therapy: WFL for tasks assessed/performed Overall Cognitive Status: Within Functional Limits for  tasks assessed                         Exercises Total Joint Exercises Ankle Circles/Pumps: AROM;Both;20 reps      Assessment/Plan    PT Assessment Patient needs continued PT services  PT Diagnosis Difficulty walking;Abnormality of gait;Generalized weakness;Acute pain   PT Problem List Decreased strength;Decreased range of motion;Decreased activity tolerance;Decreased balance;Decreased mobility;Decreased knowledge of use of DME;Pain;Decreased knowledge of precautions  PT Treatment Interventions DME instruction;Gait training;Stair training;Functional mobility training;Therapeutic activities;Therapeutic exercise;Neuromuscular re-education;Balance training;Patient/family education;Manual techniques;Modalities   PT Goals (Current goals can be found in the Care Plan section) Acute Rehab PT Goals Patient Stated Goal: to get better so she can get back home PT Goal Formulation: With patient Time For Goal Achievement: 01/10/16 Potential to Achieve Goals: Good    Frequency 7X/week           End of Session Equipment Utilized During Treatment: Gait belt;Left knee immobilizer Activity Tolerance: Patient tolerated treatment well Patient left: in chair;with call bell/phone within reach Nurse Communication: Mobility status         Time: [id number] PT Time Calculation (min) (ACUTE ONLY): 17 min   Charges:   PT Evaluation $PT Eval Moderate Complexity: 1 Procedure          Rozetta Stumpp B. [place], [place], DPT [phone]   01/03/2016, 5:16 PM

## 2016-01-03 NOTE — Progress Notes (Signed)
Orthopedic Tech Progress Note Patient Details:  [name] [birth year] [id number] Pt. refused evening CPM. Patient ID: [name], female   DOB: 1935-11-20, 81 y.o.   MRN: [id number]   Darrol Poke 01/03/2016, 9:02 PM

## 2016-01-03 NOTE — Anesthesia Procedure Notes (Addendum)
Spinal Patient location during procedure: OR Staffing Anesthesiologist: MOSER, CHRISTOPHER Preanesthetic Checklist Completed: patient identified, surgical consent, pre-op evaluation, timeout performed, IV checked, risks and benefits discussed and monitors and equipment checked Spinal Block Patient position: sitting Prep: site prepped and draped and DuraPrep Patient monitoring: heart rate, cardiac monitor, continuous pulse ox and blood pressure Approach: midline Location: L4-5 Injection technique: single-shot Needle Needle type: Pencan  Needle gauge: 24 G Needle length: 10 cm Assessment Sensory level: T6  Procedure Name: MAC Date/Time: 01/03/2016 10:53 AM Performed by: Rush Farmer E Pre-anesthesia Checklist: Patient identified, Emergency Drugs available, Suction available, Patient being monitored and Timeout performed Patient Re-evaluated:Patient Re-evaluated prior to inductionOxygen Delivery Method: Simple face mask Intubation Type: IV induction Placement Confirmation: positive ETCO2

## 2016-01-03 NOTE — Transfer of Care (Signed)
Immediate Anesthesia Transfer of Care Note  Patient: [name]  Procedure(s) Performed: Procedure(s) with comments: LEFT TOTAL KNEE ARTHROPLASTY (Left) - Total knee arthroplasty  Patient Location: PACU  Anesthesia Type:Spinal  Level of Consciousness: awake, alert  and oriented  Airway & Oxygen Therapy: Patient Spontanous Breathing and Patient connected to face mask oxygen  Post-op Assessment: Report given to RN, Post -op Vital signs reviewed and stable and Patient moving all extremities X 4  Post vital signs: Reviewed and stable  Last Vitals:  Filed Vitals:   01/03/16 0908 01/03/16 1256  BP: 142/49 122/75  Pulse: 78 62  Temp: 36.6 C 36.3 C  Resp: 18 13    Complications: No apparent anesthesia complications

## 2016-01-03 NOTE — Anesthesia Preprocedure Evaluation (Addendum)
Anesthesia Evaluation  Patient identified by MRN, date of birth, ID band Patient awake    Reviewed: Allergy & Precautions, NPO status   Airway Mallampati: II  TM Distance: >3 FB Neck ROM: Full    Dental  (+) Edentulous Upper, Edentulous Lower   Pulmonary pneumonia,    breath sounds clear to auscultation       Cardiovascular + Peripheral Vascular Disease   Rhythm:Regular Rate:Normal     Neuro/Psych    GI/Hepatic Neg liver ROS, GERD  ,  Endo/Other  negative endocrine ROS  Renal/GU negative Renal ROS     Musculoskeletal   Abdominal   Peds  Hematology   Anesthesia Other Findings   Reproductive/Obstetrics                            Anesthesia Physical Anesthesia Plan  ASA: III  Anesthesia Plan: General   Post-op Pain Management: GA combined w/ Regional for post-op pain   Induction: Intravenous  Airway Management Planned: Oral ETT  Additional Equipment:   Intra-op Plan:   Post-operative Plan: Extubation in OR  Informed Consent: I have reviewed the patients History and Physical, chart, labs and discussed the procedure including the risks, benefits and alternatives for the proposed anesthesia with the patient or authorized representative who has indicated his/her understanding and acceptance.   Dental advisory given  Plan Discussed with: CRNA and Anesthesiologist  Anesthesia Plan Comments:         Anesthesia Quick Evaluation

## 2016-01-03 NOTE — Brief Op Note (Signed)
01/03/2016  2:51 PM  PATIENT:  [name]  81 y.o. female  PRE-OPERATIVE DIAGNOSIS:  Degenerative joint disease  POST-OPERATIVE DIAGNOSIS:  Degenerative joint disease  PROCEDURE:  Procedure(s) with comments: LEFT TOTAL KNEE ARTHROPLASTY (Left) - Total knee arthroplasty  SURGEON:  Surgeon(s) and Role:    * Dorna Leitz, MD - Primary  PHYSICIAN ASSISTANT:   ASSISTANTS: bethune   ANESTHESIA:   spinal  EBL:  Total I/O In: 700 [I.V.:700] Out: 425 [Urine:375; Blood:50]  BLOOD ADMINISTERED:none  DRAINS: none   LOCAL MEDICATIONS USED:  NONE  SPECIMEN:  No Specimen  DISPOSITION OF SPECIMEN:  N/A  COUNTS:  YES  TOURNIQUET:   Total Tourniquet Time Documented: Thigh (Left) - 53 minutes Total: Thigh (Left) - 53 minutes   DICTATION: .Other Dictation: Dictation Number [id number]  PLAN OF CARE: Admit to inpatient   PATIENT DISPOSITION:  PACU - hemodynamically stable.   Delay start of Pharmacological VTE agent (>24hrs) due to surgical blood loss or risk of bleeding: no

## 2016-01-03 NOTE — Progress Notes (Signed)
Orthopedic Tech Progress Note Patient Details:  [name] Oct 09, 1935 [id number]  CPM Left Knee CPM Left Knee: On Left Knee Flexion (Degrees): 60 Left Knee Extension (Degrees): 0 Additional Comments: Trapeze bar and foot roll   [name] 01/03/2016, 1:56 PM

## 2016-01-03 NOTE — Discharge Instructions (Signed)

## 2016-01-04 LAB — BASIC METABOLIC PANEL
Anion gap: 9 (ref 5–15)
BUN: 15 mg/dL (ref 6–20)
CHLORIDE: 104 mmol/L (ref 101–111)
CO2: 24 mmol/L (ref 22–32)
Calcium: 8.4 mg/dL — ABNORMAL LOW (ref 8.9–10.3)
Creatinine, Ser: 1.04 mg/dL — ABNORMAL HIGH (ref 0.44–1.00)
GFR calc non Af Amer: 50 mL/min — ABNORMAL LOW (ref >60)
GFR, EST AFRICAN AMERICAN: 58 mL/min — AB (ref >60)
Glucose, Bld: 128 mg/dL — ABNORMAL HIGH (ref 65–99)
POTASSIUM: 3.8 mmol/L (ref 3.5–5.1)
SODIUM: 137 mmol/L (ref 135–145)

## 2016-01-04 LAB — CBC
HEMATOCRIT: 33 % — AB (ref 36.0–46.0)
HEMOGLOBIN: 11.2 g/dL — AB (ref 12.0–15.0)
MCH: 32.2 pg (ref 26.0–34.0)
MCHC: 33.9 g/dL (ref 30.0–36.0)
MCV: 94.8 fL (ref 78.0–100.0)
Platelets: 238 10*3/uL (ref 150–400)
RBC: 3.48 MIL/uL — AB (ref 3.87–5.11)
RDW: 13.6 % (ref 11.5–15.5)
WBC: 7.6 10*3/uL (ref 4.0–10.5)

## 2016-01-04 NOTE — Progress Notes (Signed)
Physical Therapy Treatment Patient Details Name: [name] MRN: [id number] DOB: 06/16/1935 Today's Date: 01/04/2016    History of Present Illness 81 y.o. female admitted to [facility] on 01/03/16 for elective L TKA.  Pt with significant PMHx of RA, DDD, fibromyalgia, vertigo, and urinary frequency.      PT Comments    Patient making slow progress with mobility and gait.  Continue to recommend SNF at discharge for continued therapy.  Follow Up Recommendations  SNF     Equipment Recommendations  Rolling walker with 5" wheels;3in1 (PT)    Recommendations for Other Services       Precautions / Restrictions Precautions Precautions: Knee Required Braces or Orthoses: Knee Immobilizer - Left Knee Immobilizer - Left: On when out of bed or walking Restrictions Weight Bearing Restrictions: Yes LLE Weight Bearing: Weight bearing as tolerated    Mobility  Bed Mobility Overal bed mobility: Needs Assistance Bed Mobility: Sit to Supine       Sit to supine: Min assist   General bed mobility comments: Verbal cues for technique.  Assist to bring LLE onto bed.  Transfers Overall transfer level: Needs assistance Equipment used: Rolling walker (2 wheeled) Transfers: Sit to/from Stand Sit to Stand: Min assist         General transfer comment: Verbal cues for hand placement.  Assist to rise from 3-in-1 and for balance.  Ambulation/Gait Ambulation/Gait assistance: Min assist Ambulation Distance (Feet): 12 Feet Assistive device: Rolling walker (2 wheeled) Gait Pattern/deviations: Step-to pattern;Decreased stance time - left;Decreased step length - right;Decreased weight shift to left;Antalgic Gait velocity: decreased Gait velocity interpretation: Below normal speed for age/gender General Gait Details: Verbal cues for safety and gait sequence.   Stairs            Wheelchair Mobility    Modified Rankin (Stroke Patients Only)       Balance                                     Cognition Arousal/Alertness: Awake/alert Behavior During Therapy: WFL for tasks assessed/performed Overall Cognitive Status: Within Functional Limits for tasks assessed                      Exercises Total Joint Exercises Ankle Circles/Pumps: AROM;Both;10 reps;Supine Quad Sets: AROM;Left;10 reps;Supine Gluteal Sets: AROM;Both;10 reps;Supine Short Arc Quad: AROM;Left;5 reps;Supine Heel Slides: AROM;Left;10 reps;Supine Hip ABduction/ADduction: AAROM;Left;10 reps;Supine    General Comments        Pertinent Vitals/Pain Pain Assessment: 0-10 Pain Score: 7  Pain Location: Lt knee Pain Descriptors / Indicators: Aching;Sore Pain Intervention(s): Limited activity within patient's tolerance;Monitored during session;Repositioned    Home Living                      Prior Function            PT Goals (current goals can now be found in the care plan section) Progress towards PT goals: Progressing toward goals    Frequency  7X/week    PT Plan Current plan remains appropriate    Co-evaluation             End of Session Equipment Utilized During Treatment: Gait belt;Left knee immobilizer Activity Tolerance: Patient tolerated treatment well Patient left: in bed;with call bell/phone within reach;with family/visitor present     Time: [id number] PT Time Calculation (min) (ACUTE ONLY): 24 min  Charges:  $  Gait Training: 8-22 mins $Therapeutic Exercise: 8-22 mins                    G Codes:      Despina Pole 02/03/16, 4:38 PM Carita Pian. Sanjuana Kava, [place] Pager [phone]

## 2016-01-04 NOTE — Progress Notes (Signed)
   PATIENT ID: [name]   1 Day Post-Op Procedure(s) (LRB): LEFT TOTAL KNEE ARTHROPLASTY (Left)  Subjective: Has trouble with pain overnight, waking up hourly. Reports pain is tolerable now and improving. Ready to get up with PT. No other complaints or concerns.  Objective:  Filed Vitals:   01/04/16 0020 01/04/16 0517  BP: 101/48 114/47  Pulse: 79 79  Temp: 98.9 F (37.2 C) 99.5 F (37.5 C)  Resp: 13 14     L knee dressing with scant dried blood, intact Wiggles toes, distally NVI\ bilat calf soft, nontender  Labs:   Recent Labs  01/04/16 0509  HGB 11.2*   Recent Labs  01/04/16 0509  WBC 7.6  RBC 3.48*  HCT 33.0*  PLT 238   Recent Labs  01/03/16 0916 01/04/16 0509  NA 140 137  K 4.7 3.8  CL 107 104  CO2 24 24  BUN 14 15  CREATININE 0.94 1.04*  GLUCOSE 101* 128*  CALCIUM 9.7 8.4*    Assessment and Plan: 1 day s/p L TKA Up with PT today D/c to [facility], possibly tomorrow if she qualifies vs Monday Minimize narcotics with tolerated  VTE proph: ASA 325mg  BID, SCDs

## 2016-01-05 LAB — CBC
HEMATOCRIT: 31.2 % — AB (ref 36.0–46.0)
Hemoglobin: 10.7 g/dL — ABNORMAL LOW (ref 12.0–15.0)
MCH: 32.2 pg (ref 26.0–34.0)
MCHC: 34.3 g/dL (ref 30.0–36.0)
MCV: 94 fL (ref 78.0–100.0)
Platelets: 239 10*3/uL (ref 150–400)
RBC: 3.32 MIL/uL — ABNORMAL LOW (ref 3.87–5.11)
RDW: 13.6 % (ref 11.5–15.5)
WBC: 10.2 10*3/uL (ref 4.0–10.5)

## 2016-01-05 NOTE — Progress Notes (Signed)
   PATIENT ID: [name]   2 Days Post-Op Procedure(s) (LRB): LEFT TOTAL KNEE ARTHROPLASTY (Left)  Subjective: Doing well today, sitting up in chair. Pain improved, just "sore" from PT yesterday. No complaints.  Objective:  Filed Vitals:   01/04/16 2030 01/05/16 0459  BP: 164/64 122/57  Pulse: 89 83  Temp: 98.8 F (37.1 C) 98.4 F (36.9 C)  Resp: 18 16     L knee dressing with scant dried blood, intact Wiggles toes, distally NVI bilat calf soft, nontender  Labs:   Recent Labs  01/04/16 0509 01/05/16 0402  HGB 11.2* 10.7*   Recent Labs  01/04/16 0509 01/05/16 0402  WBC 7.6 10.2  RBC 3.48* 3.32*  HCT 33.0* 31.2*  PLT 238 239   Recent Labs  01/03/16 0916 01/04/16 0509  NA 140 137  K 4.7 3.8  CL 107 104  CO2 24 24  BUN 14 15  CREATININE 0.94 1.04*  GLUCOSE 101* 128*  CALCIUM 9.7 8.4*    Assessment and Plan: 2 days s/p L TKA Up with PT today D/c to [facility],cannot happen until tomorrow Minimize narcotics with tolerated  VTE proph: ASA 325mg  BID, SCDs

## 2016-01-05 NOTE — Clinical Social Work Placement (Signed)
   CLINICAL SOCIAL WORK PLACEMENT  NOTE  Date:  01/05/2016  Patient Details  Name: LEONNE MONEYPENNY MRN: [id number] Date of Birth: 01-02-1935  Clinical Social Work is seeking post-discharge placement for this patient at the [place] level of care (*CSW will initial, date and re-position this form in  chart as items are completed):  No   Patient/family provided with [facility] Work Department's list of facilities offering this level of care within the geographic area requested by the patient (or if unable, by the patient's family).  Yes   Patient/family informed of their freedom to choose among providers that offer the needed level of care, that participate in Medicare, Medicaid or managed care program needed by the patient, have an available bed and are willing to accept the patient.  Yes   Patient/family informed of [facility]'s ownership interest in [facility] and [facility], as well as of the fact that they are under no obligation to receive care at these facilities.  PASRR submitted to EDS on 01/05/16     PASRR number received on 01/05/16     Existing PASRR number confirmed on       FL2 transmitted to all facilities in geographic area requested by pt/family on       FL2 transmitted to all facilities within larger geographic area on       Patient informed that his/her managed care company has contracts with or will negotiate with certain facilities, including the following:            Patient/family informed of bed offers received.  Patient chooses bed at       Physician recommends and patient chooses bed at      Patient to be transferred to   on  .  Patient to be transferred to facility by       Patient family notified on   of transfer.  Name of family member notified:        PHYSICIAN       Additional Comment:    _______________________________________________ Matilde Bash, [place] 01/05/2016, 3:45 PM

## 2016-01-05 NOTE — Progress Notes (Signed)
Physical Therapy Treatment Patient Details Name: [name] MRN: [id number] DOB: 16-Aug-1935 Today's Date: 01/05/2016    History of Present Illness 81 y.o. female admitted to [facility] on 01/03/16 for elective L TKA.  Pt with significant PMHx of RA, DDD, fibromyalgia, vertigo, and urinary frequency.      PT Comments    Continues to make good gains with mobility and gait.  To d/c to SNF for continued therapy.  Follow Up Recommendations  SNF     Equipment Recommendations  Rolling walker with 5" wheels;3in1 (PT)    Recommendations for Other Services       Precautions / Restrictions Precautions Precautions: Knee Required Braces or Orthoses: Knee Immobilizer - Left Knee Immobilizer - Left: On when out of bed or walking Restrictions Weight Bearing Restrictions: Yes LLE Weight Bearing: Weight bearing as tolerated    Mobility  Bed Mobility               General bed mobility comments: Patient OOB in chair  Transfers Overall transfer level: Needs assistance Equipment used: Rolling walker (2 wheeled) Transfers: Sit to/from Stand Sit to Stand: Min assist         General transfer comment: Patient using correct hand placement.  Assist to rise to standing.  Ambulation/Gait Ambulation/Gait assistance: Min assist Ambulation Distance (Feet): 84 Feet Assistive device: Rolling walker (2 wheeled) Gait Pattern/deviations: Step-through pattern;Decreased stance time - left;Decreased step length - right Gait velocity: decreased Gait velocity interpretation: Below normal speed for age/gender General Gait Details: Verbal cues for safety and gait sequence.   Stairs            Wheelchair Mobility    Modified Rankin (Stroke Patients Only)       Balance                                    Cognition Arousal/Alertness: Awake/alert Behavior During Therapy: WFL for tasks assessed/performed Overall Cognitive Status: Within Functional Limits for tasks assessed                       Exercises Total Joint Exercises Ankle Circles/Pumps: AROM;Both;10 reps;Seated Quad Sets: AROM;Left;10 reps;Seated Gluteal Sets: AROM;Both;10 reps;Seated Short Arc Quad: AROM;Left;10 reps;Seated Heel Slides: AAROM;Left;10 reps;Seated Hip ABduction/ADduction: AROM;Left;10 reps;Seated Goniometric ROM: -15* ext    General Comments        Pertinent Vitals/Pain Pain Assessment: 0-10 Pain Score: 5  Pain Location: Lt knee/thigh Pain Descriptors / Indicators: Aching;Sore Pain Intervention(s): Monitored during session;Premedicated before session;Repositioned    Home Living                      Prior Function            PT Goals (current goals can now be found in the care plan section) Progress towards PT goals: Progressing toward goals    Frequency  7X/week    PT Plan Current plan remains appropriate    Co-evaluation             End of Session Equipment Utilized During Treatment: Gait belt;Left knee immobilizer Activity Tolerance: Patient tolerated treatment well Patient left: in chair;with call bell/phone within reach;with chair alarm set     Time: [id number] PT Time Calculation (min) (ACUTE ONLY): 23 min  Charges:  $Gait Training: 8-22 mins $Therapeutic Exercise: 8-22 mins  G Codes:      Despina Pole 01/05/2016, 1:37 PM Carita Pian. Sanjuana Kava, [place] Pager [phone]

## 2016-01-05 NOTE — NC FL2 (Signed)
[place] LEVEL OF CARE SCREENING TOOL     IDENTIFICATION  Patient Name: [name] Birthdate: Apr 23, 1935 Sex: female Admission Date (Current Location): 01/03/2016  [facility] and [place] Number:  Herbalist and Address:  [facility] [place] [address] [place], [place] [address]      Provider Number: [id number]  Attending Physician Name and Address:  Dorna Leitz, MD  Relative Name and Phone Number:       Current Level of Care: SNF Recommended Level of Care: [place] Prior Approval Number:    Date Approved/Denied:   PASRR Number: [id number]  Discharge Plan: SNF    Current Diagnoses: Patient Active Problem List   Diagnosis Date Noted  . Primary osteoarthritis of left knee 01/03/2016  . Abnormal EKG 11/06/2015  . DJD (degenerative joint disease) of knee 11/06/2015  . Preoperative cardiovascular examination 11/06/2015  . URI (upper respiratory infection) 01/03/2013  . Osteopenia 10/12/2012  . Lymphadenitis 07/12/2012  . Elevated BP 07/12/2012  . Lower extremity pain 12/31/2011  . Carotid artery disease ([place])   . PVD (peripheral vascular disease) ([place])   . Disequilibrium 11/16/2011  . CTS (carpal tunnel syndrome) 08/31/2011  . Multiple allergies 05/22/2011  . MIXED HYPERLIPIDEMIA 12/09/2010  . Overweight(278.02) 12/09/2010  . CATARACT, SENILE, BILATERAL 12/09/2010  . BRONCHITIS, RECURRENT 12/09/2010  . GERD 12/09/2010  . DIVERTICULAR DISEASE 12/09/2010  . Rheumatoid arthritis(714.0) 12/09/2010  . LOC OSTEOARTHROS NOT SPEC PRIM/SEC UNSPEC SITE 12/09/2010  . [place] DISEASE, AXIAL SKELETON 12/09/2010  . MEASLES, HX OF 12/09/2010  . COLONIC POLYPS, HX OF 12/09/2010  . GASTRITIS, HX OF 12/09/2010  . CHICKENPOX, HX OF 12/09/2010    Orientation RESPIRATION BLADDER Height & Weight     Self, Time, Situation, Place  Normal Continent Weight: 162 lb 4.8 oz (73.619 kg) Height:  5' 1.5" (156.2 cm)   BEHAVIORAL SYMPTOMS/MOOD NEUROLOGICAL BOWEL NUTRITION STATUS      Continent    AMBULATORY STATUS COMMUNICATION OF NEEDS Skin   Limited Assist Verbally Surgical wounds                       Personal Care Assistance Level of Assistance  Dressing     Dressing Assistance: Limited assistance     Functional Limitations Info             SPECIAL CARE FACTORS FREQUENCY                       Contractures      Additional Factors Info  Allergies               Current Medications (01/05/2016):  This is the current hospital active medication list Current Facility-Administered Medications  Medication Dose Route Frequency Provider Last Rate Last Dose  . 0.9 %  sodium chloride infusion   Intravenous Continuous Gary Fleet, PA-C   Stopped at 01/04/16 1646  . acetaminophen (TYLENOL) tablet 650 mg  650 mg Oral Q6H PRN Gary Fleet, PA-C   650 mg at 01/04/16 0423   Or  . acetaminophen (TYLENOL) suppository 650 mg  650 mg Rectal Q6H PRN Gary Fleet, PA-C      . acidophilus (RISAQUAD) capsule 1 capsule  1 capsule Oral QPM Gary Fleet, PA-C   1 capsule at 01/04/16 1804  . alum & mag hydroxide-simeth (MAALOX/MYLANTA) 200-200-20 MG/5ML suspension 30 mL  30 mL Oral Q4H PRN Gary Fleet, PA-C      .  aspirin EC tablet 325 mg  325 mg Oral BID PC Gary Fleet, PA-C   325 mg at 01/05/16 0950  . bisacodyl (DULCOLAX) EC tablet 5 mg  5 mg Oral Daily PRN Gary Fleet, PA-C      . diphenhydrAMINE (BENADRYL) 12.5 MG/5ML elixir 12.5-25 mg  12.5-25 mg Oral Q4H PRN Gary Fleet, PA-C      . docusate sodium (COLACE) capsule 100 mg  100 mg Oral BID Gary Fleet, PA-C   100 mg at 01/05/16 0950  . HYDROmorphone (DILAUDID) injection 0.5-1 mg  0.5-1 mg Intravenous Q4H PRN Gary Fleet, PA-C   0.5 mg at 01/05/16 1154  . loratadine (CLARITIN) tablet 10 mg  10 mg Oral Daily PRN Gary Fleet, PA-C      . magnesium citrate solution 1 Bottle  1 Bottle Oral Once PRN Gary Fleet, PA-C      .  methocarbamol (ROBAXIN) tablet 500 mg  500 mg Oral Q6H PRN Gary Fleet, PA-C   500 mg at 01/04/16 2254   Or  . methocarbamol (ROBAXIN) 500 mg in dextrose 5 % 50 mL IVPB  500 mg Intravenous Q6H PRN Gary Fleet, PA-C   500 mg at 01/03/16 1353  . ondansetron (ZOFRAN) tablet 4 mg  4 mg Oral Q6H PRN Gary Fleet, PA-C       Or  . ondansetron [facility]) injection 4 mg  4 mg Intravenous Q6H PRN Gary Fleet, PA-C      . oxyCODONE (Oxy IR/ROXICODONE) immediate release tablet 5-10 mg  5-10 mg Oral Q4H PRN Gary Fleet, PA-C   10 mg at 01/04/16 2254  . senna-docusate (Senokot-S) tablet 1 tablet  1 tablet Oral QHS PRN Gary Fleet, PA-C   1 tablet at 01/04/16 2255  . zolpidem (AMBIEN) tablet 5 mg  5 mg Oral QHS PRN Gary Fleet, PA-C   5 mg at 01/04/16 2255     Discharge Medications: Please see discharge summary for a list of discharge medications.  Relevant Imaging Results:  Relevant Lab Results:   Additional Information    Moshe Cipro Berneice Heinrich, LCSW

## 2016-01-05 NOTE — Clinical Social Work Note (Signed)
Clinical Social Work Assessment  Patient Details  Name: [name] MRN: [id number] Date of Birth: [birth year]  Date of referral:  01/05/16               Reason for consult:  Facility Placement                Permission sought to share information with:  Facility Sport and exercise psychologist, Family Supports Permission granted to share information::  Yes, Verbal Permission Granted  Name::     Jeani Hawking and [employer]::  [place]  Relationship::  daughters  Sport and exercise psychologist Information:     Housing/Transportation Living arrangements for the past 2 months:  [place] of Information:  Patient, Adult Children Patient Interpreter Needed:  None Criminal Activity/Legal Involvement Pertinent to Current Situation/Hospitalization:  No - Comment as needed Significant Relationships:  Adult Children Lives with:  Adult Children Do you feel safe going back to the place where you live?  Yes Need for family participation in patient care:  No (Com[place])  Care giving concerns:  None, at this time    Facilities manager / plan:  Met with Pt, her 2 daughters and son-in-law at bedside.  Pt and family in agreement with SNF and Pt is pre-registered at [facility].  Pt feels that she needs SNF at d/c.  Employment status:  Retired Nurse, adult PT Recommendations:  [place] / Referral to community resources:     Patient/Family's Response to care:  Pt seemed pleased with her care at [facility].  Patient/Family's Understanding of and Emotional Response to Diagnosis, Current Treatment, and Prognosis:  Pt feels good about going to [facility] for SNF.  Emotional Assessment Appearance:  Appears stated age Attitude/Demeanor/Rapport:   (calm) Affect (typically observed):  Accepting Orientation:  Oriented to Self, Oriented to Place, Oriented to  Time, Oriented to Situation Alcohol / Substance use:  Never Used Psych involvement (Current and /or in the  community):  No (Comment)  Discharge Needs  Concerns to be addressed:  No discharge needs identified Readmission within the last 30 days:  No Current discharge risk:  None Barriers to Discharge:  No Barriers Identified   Matilde Bash, [place] 01/05/2016, 3:41 PM

## 2016-01-06 ENCOUNTER — Other Ambulatory Visit: Payer: Self-pay

## 2016-01-06 DIAGNOSIS — R2681 Unsteadiness on feet: Secondary | ICD-10-CM | POA: Diagnosis not present

## 2016-01-06 DIAGNOSIS — R278 Other lack of coordination: Secondary | ICD-10-CM | POA: Diagnosis not present

## 2016-01-06 DIAGNOSIS — M25662 Stiffness of left knee, not elsewhere classified: Secondary | ICD-10-CM | POA: Diagnosis not present

## 2016-01-06 DIAGNOSIS — K5900 Constipation, unspecified: Secondary | ICD-10-CM | POA: Diagnosis not present

## 2016-01-06 DIAGNOSIS — M1990 Unspecified osteoarthritis, unspecified site: Secondary | ICD-10-CM | POA: Diagnosis not present

## 2016-01-06 DIAGNOSIS — D62 Acute posthemorrhagic anemia: Secondary | ICD-10-CM | POA: Diagnosis not present

## 2016-01-06 DIAGNOSIS — M6281 Muscle weakness (generalized): Secondary | ICD-10-CM | POA: Diagnosis not present

## 2016-01-06 DIAGNOSIS — M6249 Contracture of muscle, multiple sites: Secondary | ICD-10-CM | POA: Diagnosis not present

## 2016-01-06 DIAGNOSIS — M25562 Pain in left knee: Secondary | ICD-10-CM | POA: Diagnosis not present

## 2016-01-06 DIAGNOSIS — K5901 Slow transit constipation: Secondary | ICD-10-CM | POA: Diagnosis not present

## 2016-01-06 DIAGNOSIS — Z4733 Aftercare following explantation of knee joint prosthesis: Secondary | ICD-10-CM | POA: Diagnosis not present

## 2016-01-06 DIAGNOSIS — J309 Allergic rhinitis, unspecified: Secondary | ICD-10-CM | POA: Diagnosis not present

## 2016-01-06 DIAGNOSIS — R600 Localized edema: Secondary | ICD-10-CM | POA: Diagnosis not present

## 2016-01-06 DIAGNOSIS — Z471 Aftercare following joint replacement surgery: Secondary | ICD-10-CM | POA: Diagnosis not present

## 2016-01-06 DIAGNOSIS — Z9889 Other specified postprocedural states: Secondary | ICD-10-CM | POA: Diagnosis not present

## 2016-01-06 DIAGNOSIS — R262 Difficulty in walking, not elsewhere classified: Secondary | ICD-10-CM | POA: Diagnosis not present

## 2016-01-06 DIAGNOSIS — Z96652 Presence of left artificial knee joint: Secondary | ICD-10-CM | POA: Diagnosis not present

## 2016-01-06 DIAGNOSIS — M1712 Unilateral primary osteoarthritis, left knee: Secondary | ICD-10-CM | POA: Diagnosis not present

## 2016-01-06 DIAGNOSIS — D508 Other iron deficiency anemias: Secondary | ICD-10-CM | POA: Diagnosis not present

## 2016-01-06 DIAGNOSIS — E559 Vitamin D deficiency, unspecified: Secondary | ICD-10-CM | POA: Diagnosis not present

## 2016-01-06 DIAGNOSIS — N289 Disorder of kidney and ureter, unspecified: Secondary | ICD-10-CM | POA: Diagnosis not present

## 2016-01-06 DIAGNOSIS — I10 Essential (primary) hypertension: Secondary | ICD-10-CM | POA: Diagnosis not present

## 2016-01-06 LAB — CBC
HEMATOCRIT: 30.8 % — AB (ref 36.0–46.0)
HEMOGLOBIN: 10.6 g/dL — AB (ref 12.0–15.0)
MCH: 32.5 pg (ref 26.0–34.0)
MCHC: 34.4 g/dL (ref 30.0–36.0)
MCV: 94.5 fL (ref 78.0–100.0)
Platelets: 246 10*3/uL (ref 150–400)
RBC: 3.26 MIL/uL — ABNORMAL LOW (ref 3.87–5.11)
RDW: 13.6 % (ref 11.5–15.5)
WBC: 8.7 10*3/uL (ref 4.0–10.5)

## 2016-01-06 MED ORDER — OXYCODONE-ACETAMINOPHEN 5-325 MG PO TABS: 1.0000 | ORAL_TABLET | Freq: Four times a day (QID) | ORAL | Status: AC | PRN

## 2016-01-06 NOTE — Progress Notes (Signed)
Pt ready for d/c to SNF per MD. Report was called to [place], Therapist, art, at [facility], all questions answered. Peripheral IV removed, belongings gathered and will be sent with pt. Waiting for transportation via [place].   [place], Jerry Caras

## 2016-01-06 NOTE — Progress Notes (Signed)
Subjective: 3 Days Post-Op Procedure(s) (LRB): LEFT TOTAL KNEE ARTHROPLASTY (Left) Patient reports pain as moderate. Taking by mouth and voiding okay.  Good progress with physical therapy.  Objective: Vital signs in last 24 hours: Temp:  [98.4 F (36.9 C)-99 F (37.2 C)] 98.8 F (37.1 C) (02/06 0500) Pulse Rate:  [80-94] 80 (02/06 0500) Resp:  [18-20] 20 (02/06 0500) BP: (105-131)/(39-53) 129/51 mmHg (02/06 0500) SpO2:  [98 %-100 %] 98 % (02/06 0500)  Intake/Output from previous day: 02/05 0701 - 02/06 0700 In: 600 [P.O.:600] Out: -  Intake/Output this shift: Total I/O In: 360 [P.O.:360] Out: -    Recent Labs  01/04/16 0509 01/05/16 0402 01/06/16 0248  HGB 11.2* 10.7* 10.6*    Recent Labs  01/05/16 0402 01/06/16 0248  WBC 10.2 8.7  RBC 3.32* 3.26*  HCT 31.2* 30.8*  PLT 239 246    Recent Labs  01/03/16 0916 01/04/16 0509  NA 140 137  K 4.7 3.8  CL 107 104  CO2 24 24  BUN 14 15  CREATININE 0.94 1.04*  GLUCOSE 101* 128*  CALCIUM 9.7 8.4*   No results for input(s): LABPT, INR in the last 72 hours. Left knee exam:  Neurovascular intact Sensation intact distally Intact pulses distally Dorsiflexion/Plantar flexion intact Incision: scant drainage Compartment soft  Assessment/Plan: 3 Days Post-Op Procedure(s) (LRB): LEFT TOTAL KNEE ARTHROPLASTY (Left)  Plan: Change dressing.  Applied new aqua cell. Up with therapy Discharge to SNF followup with Dr. Berenice Primas in 2 weeks. [name] 01/06/2016, 9:15 AM

## 2016-01-06 NOTE — Discharge Summary (Signed)
Patient ID: SHAHD VENZOR MRN: [id number] DOB/AGE: 1935-08-28 81 y.o.  Admit date: 01/03/2016 Discharge date: 01/06/2016  Admission Diagnoses:  Principal Problem:   Primary osteoarthritis of left knee   Discharge Diagnoses:  Same  Past Medical History  Diagnosis Date  . Hyperlipidemia     doesn't take any meds  . Rheumatoid arthritis(714.0) 12/09/2010  . LOC OSTEOARTHROS NOT SPEC PRIM/SEC UNSPEC SITE 12/09/2010  . DEGENERATIVE DISC DISEASE, AXIAL SKELETON 12/09/2010  . Fibromyalgia   . DDD (degenerative disc disease), lumbar   . History of bronchitis     several yrs ago  . Pneumonia     hx of-many yrs ago  . Vertigo     rarely  . Numbness     right pointer and middle finger.Left middle  . Osteoarthritis   . Joint pain   . Back pain     DDD  . Osteoporosis   . GERD     not on any meds  . Gastritis   . History of colon polyps     precancerous  . Diverticulosis   . Urinary frequency   . Nocturia   . Hemorrhoids     internal    Surgeries: Procedure(s): LEFT TOTAL KNEE ARTHROPLASTY on 01/03/2016   Discharged Condition: Improved  Hospital Course: ERSIE KUNTZ is an 81 y.o. female who was admitted 01/03/2016 for operative treatment ofPrimary osteoarthritis of left knee. Patient has severe unremitting pain that affects sleep, daily activities, and work/hobbies. After pre-op clearance the patient was taken to the operating room on 01/03/2016 and underwent  Procedure(s): LEFT TOTAL KNEE ARTHROPLASTY.    Patient was given perioperative antibiotics: Anti-infectives    Start     Dose/Rate Route Frequency Ordered Stop   01/03/16 1800  ceFAZolin (ANCEF) IVPB 2 g/50 mL premix     2 g 100 mL/hr over 30 Minutes Intravenous Every 6 hours 01/03/16 1449 01/04/16 0120   01/03/16 0915  ceFAZolin (ANCEF) IVPB 2 g/50 mL premix     2 g 100 mL/hr over 30 Minutes Intravenous To ShortStay Surgical 01/03/16 0857 01/03/16 1100       Patient was given sequential compression devices, early  ambulation, and chemoprophylaxis to prevent DVT.The patient make good progress with physical therapy.  On the date of discharge she was doing well on oral pain medicine her vital signs are stable and she was ambulating with physical therapy.  Her dressing was changed and a new Aquasol dressing was applied.  This will be left in place until she comes back to the office in 2 weeks.  She may shower with this on.  Patient benefited maximally from hospital stay and there were no complications.    Recent vital signs: Patient Vitals for the past 24 hrs:  BP Temp Temp src Pulse Resp SpO2  01/06/16 0500 (!) 129/51 mmHg 98.8 F (37.1 C) Oral 80 20 98 %  01/06/16 0221 (!) 105/50 mmHg 98.4 F (36.9 C) Oral 82 18 100 %  01/05/16 2100 (!) 131/53 mmHg 99 F (37.2 C) Oral 94 18 100 %  01/05/16 1224 (!) 115/39 mmHg 98.5 F (36.9 C) Oral 82 18 100 %     Recent laboratory studies:  Recent Labs  01/04/16 0509 01/05/16 0402 01/06/16 0248  WBC 7.6 10.2 8.7  HGB 11.2* 10.7* 10.6*  HCT 33.0* 31.2* 30.8*  PLT 238 239 246  NA 137  --   --   K 3.8  --   --   CL 104  --   --  CO2 24  --   --   BUN 15  --   --   CREATININE 1.04*  --   --   GLUCOSE 128*  --   --   CALCIUM 8.4*  --   --      Discharge Medications:     Medication List    TAKE these medications        ACIDOPHILUS PO  Take 1 tablet by mouth every evening.     ascorbic acid 500 MG tablet  Commonly known as:  VITAMIN C  Take 500 mg by mouth 3 (three) times daily.     aspirin EC 325 MG tablet  Take 1 tablet (325 mg total) by mouth 2 (two) times daily after a meal. Take x 1 month post op to decrease risk of blood clots.     b complex vitamins tablet  Take 1 tablet by mouth daily.     baclofen 10 MG tablet  Commonly known as:  LIORESAL  Take 1 tablet (10 mg total) by mouth every 8 (eight) hours as needed for muscle spasms.     BENEFIBER PO  Take 1 Dose by mouth daily.     CALCIUM 600 PO  Take 1,200 mg by mouth 2 (two) times  daily.     cholecalciferol 1000 units tablet  Commonly known as:  VITAMIN D  Take 2,000 Units by mouth daily.     Co Q-10 100 MG Caps  Take 100 mg by mouth daily.     fish oil-omega-3 fatty acids 1000 MG capsule  Take 3 g by mouth daily.     loratadine 10 MG tablet  Commonly known as:  CLARITIN  Take 10 mg by mouth daily as needed for allergies. Reported on 12/20/2015     Magnesium 250 MG Tabs  Take 1 tablet by mouth 3 (three) times daily.     milk thistle 175 MG tablet  Take 175 mg by mouth 2 (two) times daily.     multivitamin with minerals tablet  Take 1 tablet by mouth daily.     OVER THE COUNTER MEDICATION  Take 1 Dose by mouth daily. Vari-Gone: 48 mg Vitamin C per capsule plus horse chestnut seed extract, fenugreek seed, rutin, hesperidin, lemon bioflavonoid extract and butcher's broom rhizome extract.     oxyCODONE-acetaminophen 5-325 MG tablet  Commonly known as:  PERCOCET/ROXICET  Take 1-2 tablets by mouth every 6 (six) hours as needed for severe pain.     PAPAYA PO  Take 4 tablets by mouth 3 (three) times daily with meals.        Diagnostic Studies: Dg Chest 2 View  12/23/2015  CLINICAL DATA:  Preoperative examination prior to left total knee arthroplasty EXAM: CHEST  2 VIEW COMPARISON:  PA and lateral chest x-ray of July 25, 2015 FINDINGS: The lungs are mildly hyperinflated. There is no focal infiltrate. There is no pleural effusion. There are stable calcified nodules in the upper lobes. The heart and pulmonary vascularity are normal. There is mild tortuosity of the descending thoracic aorta. There are degenerative changes of both shoulders. The thoracic spine exhibits mild degenerative disc change at multiple levels. IMPRESSION: COPD. Evidence of previous granulomatous infection. There is no active cardiopulmonary disease. Electronically Signed   By: David  Martinique M.D.   On: 12/23/2015 11:46    Disposition: skilled nursing facility.      Discharge  Instructions    CPM    Complete by:  As directed  Continuous passive motion machine (CPM):      Use the CPM from 0 to 60 for 8 hours per day.      You may increase by 5-10 per day.  You may break it up into 2 or 3 sessions per day.      Use CPM for 1-2 weeks or until you are told to stop.     Call MD / Call 911    Complete by:  As directed   If you experience chest pain or shortness of breath, CALL 911 and be transported to the hospital emergency room.  If you develope a fever above 101 F, pus (white drainage) or increased drainage or redness at the wound, or calf pain, call your surgeon's office.     Constipation Prevention    Complete by:  As directed   Drink plenty of fluids.  Prune juice may be helpful.  You may use a stool softener, such as Colace (over the counter) 100 mg twice a day.  Use MiraLax (over the counter) for constipation as needed.     Diet general    Complete by:  As directed      Do not put a pillow under the knee. Place it under the heel.    Complete by:  As directed      Increase activity slowly as tolerated    Complete by:  As directed      Weight bearing as tolerated    Complete by:  As directed   Laterality:  left  Extremity:  Lower           Follow-up Information    Follow up with GRAVES,JOHN L, MD. Schedule an appointment as soon as possible for a visit in 2 weeks.   Specialty:  Orthopedic Surgery   Contact information:   [place] [address] [phone]        Signed: Erlene Senters 01/06/2016, 9:18 AM

## 2016-01-06 NOTE — Telephone Encounter (Signed)
Rx faxed to [facility] @ [phone], phone number [phone]  

## 2016-01-06 NOTE — Care Management Important Message (Signed)
Important Message  Patient Details  Name: CHITRA IGOU MRN: [id number] Date of Birth: 06-26-1935   Medicare Important Message Given:  Yes    Louanne Belton 01/06/2016, 11:41 AMImportant Message  Patient Details  Name: BRAYLEY ROELLE MRN: [id number] Date of Birth: December 29, 1934   Medicare Important Message Given:  Yes    Shirleen Mcfaul G 01/06/2016, 11:41 AM

## 2016-01-06 NOTE — Clinical Social Work Note (Signed)
Patient to be d/c'ed today to [facility].  Patient and family agreeable to plans will transport via ems RN to call report to [phone].  Meili Kleckley, MSW, LCSWA [phone]  

## 2016-01-06 NOTE — Progress Notes (Signed)
Physical Therapy Treatment Patient Details Name: [name] MRN: [id number] DOB: 1935-06-02 Today's Date: 01/06/2016    History of Present Illness 81 y.o. female admitted to [facility] on 01/03/16 for elective L TKA.  Pt with significant PMHx of RA, DDD, fibromyalgia, vertigo, and urinary frequency.      PT Comments    Continuing progress towards goals; noting good L stance stability -- no pressing need to wear KI at this time; Overall progressing well; Anticipate continuing good progress at post-acute rehabilitation.   Follow Up Recommendations  SNF     Equipment Recommendations  Rolling walker with 5" wheels;3in1 (PT)    Recommendations for Other Services       Precautions / Restrictions Precautions Precautions: Knee Precaution Comments: Pt educated to not allow any pillow or bolster under knee for healing with optimal range of motion.  Knee Immobilizer - Left: On when out of bed or walking (Nice, stable knee in stance) Restrictions LLE Weight Bearing: Weight bearing as tolerated    Mobility  Bed Mobility                  Transfers Overall transfer level: Needs assistance Equipment used: Rolling walker (2 wheeled) Transfers: Sit to/from Stand Sit to Stand: Min guard         General transfer comment: Cues for technique, and to try to allow for knee flexion during transitions  Ambulation/Gait Ambulation/Gait assistance: Min guard Ambulation Distance (Feet): 100 Feet Assistive device: Rolling walker (2 wheeled) Gait Pattern/deviations: Step-through pattern Gait velocity: decreased   General Gait Details: Cues for L quad activation for better stance stability; minimal knee buckle; adjust RW for better fit   Stairs            Wheelchair Mobility    Modified Rankin (Stroke Patients Only)       Balance     Sitting balance-Leahy Scale: Good       Standing balance-Leahy Scale: Good                      Cognition Arousal/Alertness:  Awake/alert Behavior During Therapy: WFL for tasks assessed/performed Overall Cognitive Status: Within Functional Limits for tasks assessed                      Exercises Total Joint Exercises Quad Sets: AROM;Left;10 reps;Seated Straight Leg Raises: AAROM;Left;10 reps Long Arc Quad: AAROM;Left;Other (comment) (7 reps) Knee Flexion: AROM;AAROM;5 reps;Seated (taught self-AAROM with RLE pushing LLE into more flexion) Goniometric ROM: approx 5-80 deg    General Comments        Pertinent Vitals/Pain Pain Assessment: 0-10 Pain Score: 6  Pain Location: L Knee Pain Descriptors / Indicators: Aching;Discomfort;Grimacing Pain Intervention(s): Limited activity within patient's tolerance;Monitored during session    Home Living                      Prior Function            PT Goals (current goals can now be found in the care plan section) Acute Rehab PT Goals Patient Stated Goal: to get better so she can get back home; be able to stand and play guitar PT Goal Formulation: With patient Time For Goal Achievement: 01/10/16 Potential to Achieve Goals: Good Progress towards PT goals: Progressing toward goals    Frequency  7X/week    PT Plan Current plan remains appropriate    Co-evaluation  End of Session Equipment Utilized During Treatment: Gait belt (KI not needed) Activity Tolerance: Patient tolerated treatment well Patient left: in chair;with call bell/phone within reach;with chair alarm set     Time: [id number] PT Time Calculation (min) (ACUTE ONLY): 31 min  Charges:  $Gait Training: 8-22 mins $Therapeutic Exercise: 8-22 mins                    G Codes:      Roney Marion Hamff 01/06/2016, 1:12 PM

## 2016-01-07 ENCOUNTER — Non-Acute Institutional Stay (SKILLED_NURSING_FACILITY): Payer: Commercial Managed Care - HMO | Admitting: Adult Health

## 2016-01-07 ENCOUNTER — Encounter: Payer: Self-pay | Admitting: Adult Health

## 2016-01-07 DIAGNOSIS — E559 Vitamin D deficiency, unspecified: Secondary | ICD-10-CM | POA: Diagnosis not present

## 2016-01-07 DIAGNOSIS — D62 Acute posthemorrhagic anemia: Secondary | ICD-10-CM | POA: Diagnosis not present

## 2016-01-07 DIAGNOSIS — K5901 Slow transit constipation: Secondary | ICD-10-CM | POA: Diagnosis not present

## 2016-01-07 DIAGNOSIS — M1712 Unilateral primary osteoarthritis, left knee: Secondary | ICD-10-CM

## 2016-01-07 DIAGNOSIS — J309 Allergic rhinitis, unspecified: Secondary | ICD-10-CM | POA: Diagnosis not present

## 2016-01-07 LAB — TYPE AND SCREEN
ABO/RH(D): O NEG
Antibody Screen: POSITIVE
DAT, IgG: NEGATIVE
DONOR AG TYPE: NEGATIVE
Donor AG Type: NEGATIVE
PT AG Type: NEGATIVE
UNIT DIVISION: 0
Unit division: 0

## 2016-01-07 NOTE — Progress Notes (Signed)
Patient ID: [name], female   DOB: May 18, 1935, 81 y.o.   MRN: [id number]    DATE:  01/07/2016   MRN:  [id number]  BIRTHDAY: [birth year]  Facility:  Nursing Home Location:  [place] Room Number: 1202-P  LEVEL OF CARE:  SNF [phone]  Contact Information    Name Relation Home Work [place] Daughter [phone]    [facility] Daughter [phone]         Code Status History    This patient does not have a recorded code status. Please follow your organizational policy for patients in this situation.    Advance Directive Documentation        Most Recent Value   Type of Advance Directive  Out of facility DNR (pink MOST or yellow form)   Pre-existing out of facility DNR order (yellow form or pink MOST form)     "MOST" Form in Place?         Chief Complaint  Patient presents with  . Hospitalization Follow-up    Osteoarthritis S/P left total knee arthroplasty, vitamin D deficiency, allergic rhinitis, anemia and constipation    HISTORY OF PRESENT ILLNESS:  This is a 81 year old female who was been admitted to [facility] on 01/06/16 from [facility]. She has PMH of hyperlipidemia, RA, fibromyalgia, lumbar DDD, vertigo and diverticulosis. She has osteoarthritis of left knee for which she had left total knee arthroplasty on 01/03/16. She has been admitted for a short-term rehabilitation.  PAST MEDICAL HISTORY:  Past Medical History  Diagnosis Date  . Hyperlipidemia     doesn't take any meds  . Rheumatoid arthritis(714.0) 12/09/2010  . LOC OSTEOARTHROS NOT SPEC PRIM/SEC UNSPEC SITE 12/09/2010  . DEGENERATIVE DISC DISEASE, AXIAL SKELETON 12/09/2010  . Fibromyalgia   . DDD (degenerative disc disease), lumbar   . History of bronchitis     several yrs ago  . Pneumonia     hx of-many yrs ago  . Vertigo     rarely  . Numbness     right pointer and middle finger.Left middle  . Osteoarthritis   . Joint pain   .  Back pain     DDD  . Osteoporosis   . GERD     not on any meds  . Gastritis   . History of colon polyps     precancerous  . Diverticulosis   . Urinary frequency   . Nocturia   . Hemorrhoids     internal  . Heart attack ([place])   . Obesity   . HTN (hypertension)   . Cancer ([place])   . Stroke ([place])   . Diabetes ([place])      CURRENT MEDICATIONS: Reviewed  Patient's Medications  New Prescriptions   No medications on file  Previous Medications   ASCORBIC ACID (VITAMIN C) 500 MG TABLET    Take 500 mg by mouth 3 (three) times daily.    ASPIRIN EC 325 MG TABLET    Take 1 tablet (325 mg total) by mouth 2 (two) times daily after a meal. Take x 1 month post op to decrease risk of blood clots.   B COMPLEX VITAMINS TABLET    Take 1 tablet by mouth daily.     BACLOFEN (LIORESAL) 10 MG TABLET    Take 1 tablet (10 mg total) by mouth every 8 (eight) hours as needed for muscle spasms.   CALCIUM CARBONATE (CALCIUM 600 PO)    Take 1,200  mg by mouth 2 (two) times daily.    COENZYME Q10 (CO Q-10) 100 MG CAPS    Take 100 mg by mouth daily.   ERGOCALCIFEROL (VITAMIN D2) 2000 UNITS TABS    Take 1 tablet by mouth daily.   FISH OIL-OMEGA-3 FATTY ACIDS 1000 MG CAPSULE    Take 3 g by mouth daily.    LACTOBACILLUS (ACIDOPHILUS PO)    Take 1 tablet by mouth every evening.   LORATADINE (CLARITIN) 10 MG TABLET    Take 10 mg by mouth daily as needed for allergies. Reported on 12/20/2015   MAGNESIUM 250 MG TABS    Take 1 tablet by mouth 3 (three) times daily.    MILK THISTLE 175 MG TABLET    Take 175 mg by mouth 2 (two) times daily.   MULTIPLE VITAMINS-MINERALS (CERTAVITE SENIOR/ANTIOXIDANT PO)    Take 1 tablet by mouth daily.   OXYCODONE-ACETAMINOPHEN (PERCOCET/ROXICET) 5-325 MG TABLET    Take 1-2 tablets by mouth every 6 (six) hours as needed for severe pain. DO NOT EXCEED 4GM OF APAP IN 24 HOURS FROM ALL SOURCES   WHEAT DEXTRIN (BENEFIBER PO)    Take 1 Dose by mouth daily.  Modified Medications   No medications  on file  Discontinued Medications   No medications on file     Allergies  Allergen Reactions  . Codeine   . Naproxen Hypertension  . Esomeprazole Magnesium Other (See Comments)    Abdominal pain  . Statins Other (See Comments)    Myalgia and stomach pain  . Welchol [Colesevelam Hcl] Other (See Comments)    Myalgia and constipation     REVIEW OF SYSTEMS:  GENERAL: no change in appetite, no fatigue, no weight changes, no fever, chills or weakness EYES: Denies change in vision, dry eyes, eye pain, itching or discharge EARS: Denies change in hearing, ringing in ears, or earache NOSE: Denies nasal congestion or epistaxis MOUTH and THROAT: Denies oral discomfort, gingival pain or bleeding, pain from teeth or hoarseness   RESPIRATORY: no cough, SOB, DOE, wheezing, hemoptysis CARDIAC: no chest pain, edema or palpitations GI: no abdominal pain, diarrhea, constipation, heart burn, nausea or vomiting GU: Denies dysuria, frequency, hematuria, incontinence, or discharge PSYCHIATRIC: Denies feeling of depression or anxiety. No report of hallucinations, insomnia, paranoia, or agitation   PHYSICAL EXAMINATION  GENERAL APPEARANCE: Well nourished. In no acute distress. Normal body habitus SKIN:  Left knee surgical incision is covered with Aquacel dressing, dry, no erythema  HEAD: Normal in size and contour. No evidence of trauma EYES: Lids open and close normally. No blepharitis, entropion or ectropion. PERRL. Conjunctivae are clear and sclerae are white. Lenses are without opacity EARS: Pinnae are normal. Patient hears normal voice tunes of the examiner MOUTH and THROAT: Lips are without lesions. Oral mucosa is moist and without lesions. Tongue is normal in shape, size, and color and without lesions NECK: supple, trachea midline, no neck masses, no thyroid tenderness, no thyromegaly LYMPHATICS: no LAN in the neck, no supraclavicular LAN RESPIRATORY: breathing is even & unlabored, BS  CTAB CARDIAC: RRR, no murmur,no extra heart sounds, no edema GI: abdomen soft, normal BS, no masses, no tenderness, no hepatomegaly, no splenomegaly EXTREMITIES:  able to move 4 extremities PSYCHIATRIC: Alert and oriented X 3. Affect and behavior are appropriate  LABS/RADIOLOGY: Labs reviewed: Basic Metabolic Panel:  Recent Labs  01/03/16 0916 01/04/16 0509  NA 140 137  K 4.7 3.8  CL 107 104  CO2 24 24  GLUCOSE 101*  128*  BUN 14 15  CREATININE 0.94 1.04*  CALCIUM 9.7 8.4*   CBC:  Recent Labs  12/23/15 0945 01/04/16 0509 01/05/16 0402 01/06/16 0248  WBC 7.2 7.6 10.2 8.7  NEUTROABS 4.1  --   --   --   HGB 13.3 11.2* 10.7* 10.6*  HCT 39.4 33.0* 31.2* 30.8*  MCV 94.9 94.8 94.0 94.5  PLT 274 238 239 246     Dg Chest 2 View  12/23/2015  CLINICAL DATA:  Preoperative examination prior to left total knee arthroplasty EXAM: CHEST  2 VIEW COMPARISON:  PA and lateral chest x-ray of July 25, 2015 FINDINGS: The lungs are mildly hyperinflated. There is no focal infiltrate. There is no pleural effusion. There are stable calcified nodules in the upper lobes. The heart and pulmonary vascularity are normal. There is mild tortuosity of the descending thoracic aorta. There are degenerative changes of both shoulders. The thoracic spine exhibits mild degenerative disc change at multiple levels. IMPRESSION: COPD. Evidence of previous granulomatous infection. There is no active cardiopulmonary disease. Electronically Signed   By: David  Martinique M.D.   On: 12/23/2015 11:46    ASSESSMENT/PLAN:  Osteoarthritis S/P left total knee arthroplasty - for rehabilitation; continue aspirin 325 mg 1 tab by mouth twice a day 1 month for DVT prophylaxis; baclofen 10 mg 1 tab by mouth every 8 hours when necessary for muscle spasm; Percocet 5/325 mg 1-2 tabs by mouth every 6 hours when necessary for pain; LLE WBAT; follow-up with Dr. Dorna Leitz, orthopedic surgeon, in 2 weeks; check BMP  Vitamin D  deficiency - continue vitamin D3 1000 units take 2 tabs = 2000 units daily  Allergic rhinitis - continue loratadine 10 mg 1 tab by mouth daily when necessary  Anemia, acute blood loss - hemoglobin 10.6; check CBC  Constipation - continue magnesium 250 mg 1 tab by mouth 3 times a day and Benefiber 1 packet by mouth daily      Goals of care:  Short-term rehabilitation    [facility], NP [facility] [phone]

## 2016-01-08 ENCOUNTER — Encounter: Payer: Self-pay | Admitting: Internal Medicine

## 2016-01-08 ENCOUNTER — Non-Acute Institutional Stay (SKILLED_NURSING_FACILITY): Payer: Commercial Managed Care - HMO | Admitting: Internal Medicine

## 2016-01-08 DIAGNOSIS — K5900 Constipation, unspecified: Secondary | ICD-10-CM | POA: Diagnosis not present

## 2016-01-08 DIAGNOSIS — R2681 Unsteadiness on feet: Secondary | ICD-10-CM | POA: Diagnosis not present

## 2016-01-08 DIAGNOSIS — M1712 Unilateral primary osteoarthritis, left knee: Secondary | ICD-10-CM

## 2016-01-08 DIAGNOSIS — E559 Vitamin D deficiency, unspecified: Secondary | ICD-10-CM

## 2016-01-08 DIAGNOSIS — N289 Disorder of kidney and ureter, unspecified: Secondary | ICD-10-CM | POA: Diagnosis not present

## 2016-01-08 DIAGNOSIS — K5909 Other constipation: Secondary | ICD-10-CM

## 2016-01-08 DIAGNOSIS — D62 Acute posthemorrhagic anemia: Secondary | ICD-10-CM

## 2016-01-08 LAB — CBC AND DIFFERENTIAL
HEMATOCRIT: 33 % — AB (ref 36–46)
Hemoglobin: 10.9 g/dL — AB (ref 12.0–16.0)
Neutrophils Absolute: 3 /uL
Platelets: 326 10*3/uL (ref 150–399)
WBC: 5.6 10*3/mL

## 2016-01-08 LAB — BASIC METABOLIC PANEL
BUN: 14 mg/dL (ref 4–21)
CREATININE: 0.8 mg/dL (ref 0.5–1.1)
GLUCOSE: 91 mg/dL
POTASSIUM: 4.7 mmol/L (ref 3.4–5.3)
Sodium: 141 mmol/L (ref 137–147)

## 2016-01-08 NOTE — Progress Notes (Signed)
Patient ID: [name], female   DOB: 06/17/1935, 81 y.o.   MRN: [id number]    LOCATION: [place]  PCP: Martinique, BETTY G, MD   Code Status: DNR  Goals of care: Advanced Directive information Advanced Directives 01/08/2016  Does patient have an advance directive? Yes  Type of Advance Directive Out of facility DNR (pink MOST or yellow form)  Does patient want to make changes to advanced directive? No - Patient declined  Copy of advanced directive(s) in chart? Yes     Extended Emergency Contact Information Primary Emergency Contact: Bowder,Lynn Address: [address], [place] Phone: [phone] Work Phone: [phone] Relation: Daughter Secondary Emergency Contact: Weyman [place] Phone: [phone] Relation: Daughter   Allergies  Allergen Reactions  . Codeine   . Naproxen Hypertension  . Esomeprazole Magnesium Other (See Comments)    Abdominal pain  . Statins Other (See Comments)    Myalgia and stomach pain  . Welchol [Colesevelam Hcl] Other (See Comments)    Myalgia and constipation    Chief Complaint  Patient presents with  . New Admit To SNF    New Admission     HPI:  Patient is a 81 y.o. female seen today for short term rehabilitation post hospital admission from 01/03/16-01/06/16 with left knee OA. She underwent left total knee arthroplasty. She is seen in her room today. Her pain is under control with current pain regimen. She has been constipated and would like to take benefibre that she was taking at home. Her daughter is to bring it from home today.   Review of Systems:  Constitutional: Negative for fever, chills, diaphoresis.  HENT: Negative for headache, congestion, nasal discharge Eyes: Negative for blurred vision, double vision and discharge.  Respiratory: Negative for cough, shortness of breath and wheezing.   Cardiovascular: Negative for chest pain, palpitations, leg  swelling.  Gastrointestinal: Negative for heartburn, nausea, vomiting, abdominal pain Genitourinary: Negative for dysuria Musculoskeletal: Negative for back pain, falls Skin: Negative for itching, rash.  Neurological: Negative for dizziness, tingling. Psychiatric/Behavioral: Negative for depression    Past Medical History  Diagnosis Date  . Hyperlipidemia     doesn't take any meds  . Rheumatoid arthritis(714.0) 12/09/2010  . LOC OSTEOARTHROS NOT SPEC PRIM/SEC UNSPEC SITE 12/09/2010  . DEGENERATIVE DISC DISEASE, AXIAL SKELETON 12/09/2010  . Fibromyalgia   . DDD (degenerative disc disease), lumbar   . History of bronchitis     several yrs ago  . Pneumonia     hx of-many yrs ago  . Vertigo     rarely  . Numbness     right pointer and middle finger.Left middle  . Osteoarthritis   . Joint pain   . Back pain     DDD  . Osteoporosis   . GERD     not on any meds  . Gastritis   . History of colon polyps     precancerous  . Diverticulosis   . Urinary frequency   . Nocturia   . Hemorrhoids     internal  . Heart attack ([place])   . Obesity   . HTN (hypertension)   . Cancer ([place])   . Stroke ([place])   . Diabetes [facility])    Past Surgical History  Procedure Laterality Date  . Cesarean section  1965    RH neg  . Cataract extraction  12/06/12    right eye,  laser surgery  . Tubal ligation    . Appendectomy      at age 50  . Colonoscopy    . Total knee arthroplasty Left 01/03/2016  . Total knee arthroplasty Left 01/03/2016    Procedure: LEFT TOTAL KNEE ARTHROPLASTY;  Surgeon: Dorna Leitz, MD;  Location: [place];  Service: Orthopedics;  Laterality: Left;  Total knee arthroplasty   Social History:   reports that she has never smoked. She has never used smokeless tobacco. She reports that she does not drink alcohol or use illicit drugs.  Family History  Problem Relation Age of Onset  . Alcohol abuse Mother   . Asthma Mother   . Obesity Mother   . Aneurysm Father     abdominal aortic    . Heart attack Sister   . Obesity Sister   . Hypertension Sister   . Cancer Brother     abdominal, bone  . Colon polyps Daughter   . Heart disease Paternal Grandfather   . Cancer Sister     skin, recurrent breast cancer s/p mastectomy, declined other  treatment  . Obesity Sister   . Hypertension Sister   . Stroke Brother     hemorrhagic  . Hypertension Brother   . Diabetes Brother   . Arthritis Brother   . Hypertension Brother   . Arthritis Daughter   . Hyperlipidemia Daughter   . Obesity Daughter   . Colon polyps Daughter     precancerous  . Endometriosis Daughter     Medications:   Medication List       This list is accurate as of: 01/08/16 12:25 PM.  Always use your most recent med list.               ACIDOPHILUS PO  Take 1 tablet by mouth every evening.     ascorbic acid 500 MG tablet  Commonly known as:  VITAMIN C  Take 500 mg by mouth 3 (three) times daily.     aspirin EC 325 MG tablet  Take 1 tablet (325 mg total) by mouth 2 (two) times daily after a meal. Take x 1 month post op to decrease risk of blood clots.     b complex vitamins tablet  Take 1 tablet by mouth daily.     baclofen 10 MG tablet  Commonly known as:  LIORESAL  Take 1 tablet (10 mg total) by mouth every 8 (eight) hours as needed for muscle spasms.     BENEFIBER PO  Take 1 Dose by mouth daily.     CALCIUM 600 PO  Take 1,200 mg by mouth 2 (two) times daily.     CERTAVITE SENIOR/ANTIOXIDANT PO  Take 1 tablet by mouth daily.     Co Q-10 100 MG Caps  Take 100 mg by mouth daily.     fish oil-omega-3 fatty acids 1000 MG capsule  Take 3 g by mouth daily.     loratadine 10 MG tablet  Commonly known as:  CLARITIN  Take 10 mg by mouth daily as needed for allergies. Reported on 12/20/2015     Magnesium 250 MG Tabs  Take 1 tablet by mouth 3 (three) times daily.     milk thistle 175 MG tablet  Take 175 mg by mouth 2 (two) times daily.     oxyCODONE-acetaminophen 5-325 MG tablet   Commonly known as:  PERCOCET/ROXICET  Take 1-2 tablets by mouth every 6 (six) hours as needed for severe pain. DO NOT EXCEED 4GM  OF APAP IN 24 HOURS FROM ALL SOURCES     Vitamin D2 2000 units Tabs  Take 1 tablet by mouth daily.        Immunizations: Immunization History  Administered Date(s) Administered  . Influenza Split 08/31/2011, 08/29/2012  . Influenza Whole 08/30/2010  . PPD Test 01/06/2016  . Pneumococcal Polysaccharide-23 11/30/2008  . Td 11/30/2001     Physical Exam: Filed Vitals:   01/08/16 1216  BP: 120/59  Pulse: 79  Temp: 98.2 F (36.8 C)  TempSrc: Oral  Resp: 16  Height: 5' 1.5" (1.562 m)  Weight: 162 lb (73.483 kg)  SpO2: 95%   Body mass index is 30.12 kg/(m^2).  General- elderly female, overweight, in no acute distress Head- normocephalic, atraumatic Nose- no maxillary or frontal sinus tenderness, no nasal discharge Throat- moist mucus membrane Eyes- PERRLA, EOMI, no pallor, no icterus, no discharge, normal conjunctiva, normal sclera Neck- no cervical lymphadenopathy Cardiovascular- normal s1,s2, no murmurs, palpable dorsalis pedis and radial pulses, trace left leg edema Respiratory- bilateral clear to auscultation, no wheeze, no rhonchi, no crackles, no use of accessory muscles Abdomen- bowel sounds present, soft, non tender Musculoskeletal- able to move all 4 extremities, limited left knee ROM Neurological- no focal deficit, alert and oriented to person, place and time Skin- warm and dry, left knee surgical incision with aquacel dressing in place Psychiatry- normal mood and affect    Labs reviewed: Basic Metabolic Panel:  Recent Labs  01/03/16 0916 01/04/16 0509  NA 140 137  K 4.7 3.8  CL 107 104  CO2 24 24  GLUCOSE 101* 128*  BUN 14 15  CREATININE 0.94 1.04*  CALCIUM 9.7 8.4*   Liver Function Tests: No results for input(s): AST, ALT, ALKPHOS, BILITOT, PROT, ALBUMIN in the last 8760 hours. No results for input(s): LIPASE,  AMYLASE in the last 8760 hours. No results for input(s): AMMONIA in the last 8760 hours. CBC:  Recent Labs  12/23/15 0945 01/04/16 0509 01/05/16 0402 01/06/16 0248  WBC 7.2 7.6 10.2 8.7  NEUTROABS 4.1  --   --   --   HGB 13.3 11.2* 10.7* 10.6*  HCT 39.4 33.0* 31.2* 30.8*  MCV 94.9 94.8 94.0 94.5  PLT 274 238 239 246    Radiological Exams: Dg Chest 2 View  12/23/2015  CLINICAL DATA:  Preoperative examination prior to left total knee arthroplasty EXAM: CHEST  2 VIEW COMPARISON:  PA and lateral chest x-ray of July 25, 2015 FINDINGS: The lungs are mildly hyperinflated. There is no focal infiltrate. There is no pleural effusion. There are stable calcified nodules in the upper lobes. The heart and pulmonary vascularity are normal. There is mild tortuosity of the descending thoracic aorta. There are degenerative changes of both shoulders. The thoracic spine exhibits mild degenerative disc change at multiple levels. IMPRESSION: COPD. Evidence of previous granulomatous infection. There is no active cardiopulmonary disease. Electronically Signed   By: David  Martinique M.D.   On: 12/23/2015 11:46    Assessment/Plan  Unsteady gait Will have her work with physical therapy and occupational therapy team to help with gait training and muscle strengthening exercises.fall precautions. Skin care. Encourage to be out of bed.   Left knee Osteoarthritis  S/P left total knee arthroplasty. Has follow up with orthopedics. Continue percocet 5/325 mg 1-2 tab q6h prn pain. Continue baclofen 10 mg tid prn muscle spasm. WBAT. To use CPM machine. Will have patient work with PT/OT as tolerated to regain strength and restore function.  Fall precautions are  in place. Continue aspirin 325 mg bid for dvt prophylaxis. Does not want ted hose at present. Keep legs elevated at rest to help with leg edema.  Blood loss anemia Post op, monitor cbc  Constipation Continue benefiber. Start senokot 1 tab qhs as well. Maintain  hydration  Acute renal impairment Monitor BMP  Vitamin D deficiency Continue vitamin d 2000 units daily   Goals of care: short term rehabilitation   Labs/tests ordered: cbc, bmp  Family/ staff Communication: reviewed care plan with patient and nursing supervisor    Blanchie Serve, MD Internal Medicine [address], [place] [address] Cell Phone (Monday-Friday 8 am - 5 pm): [phone] On Call: [phone] and follow prompts after 5 pm and on weekends Office Phone: [phone] Office Fax: [phone]

## 2016-01-15 DIAGNOSIS — R2681 Unsteadiness on feet: Secondary | ICD-10-CM | POA: Diagnosis not present

## 2016-01-15 DIAGNOSIS — M6249 Contracture of muscle, multiple sites: Secondary | ICD-10-CM | POA: Diagnosis not present

## 2016-01-15 DIAGNOSIS — M6281 Muscle weakness (generalized): Secondary | ICD-10-CM | POA: Diagnosis not present

## 2016-01-15 DIAGNOSIS — R600 Localized edema: Secondary | ICD-10-CM | POA: Diagnosis not present

## 2016-01-15 DIAGNOSIS — M25562 Pain in left knee: Secondary | ICD-10-CM | POA: Diagnosis not present

## 2016-01-15 DIAGNOSIS — Z4733 Aftercare following explantation of knee joint prosthesis: Secondary | ICD-10-CM | POA: Diagnosis not present

## 2016-01-15 DIAGNOSIS — Z96652 Presence of left artificial knee joint: Secondary | ICD-10-CM | POA: Diagnosis not present

## 2016-01-15 DIAGNOSIS — M25662 Stiffness of left knee, not elsewhere classified: Secondary | ICD-10-CM | POA: Diagnosis not present

## 2016-01-16 DIAGNOSIS — M6249 Contracture of muscle, multiple sites: Secondary | ICD-10-CM | POA: Diagnosis not present

## 2016-01-16 DIAGNOSIS — M6281 Muscle weakness (generalized): Secondary | ICD-10-CM | POA: Diagnosis not present

## 2016-01-16 DIAGNOSIS — Z9889 Other specified postprocedural states: Secondary | ICD-10-CM | POA: Diagnosis not present

## 2016-01-16 DIAGNOSIS — Z96652 Presence of left artificial knee joint: Secondary | ICD-10-CM | POA: Diagnosis not present

## 2016-01-16 DIAGNOSIS — R2681 Unsteadiness on feet: Secondary | ICD-10-CM | POA: Diagnosis not present

## 2016-01-16 DIAGNOSIS — Z4733 Aftercare following explantation of knee joint prosthesis: Secondary | ICD-10-CM | POA: Diagnosis not present

## 2016-01-16 DIAGNOSIS — M25662 Stiffness of left knee, not elsewhere classified: Secondary | ICD-10-CM | POA: Diagnosis not present

## 2016-01-16 DIAGNOSIS — M25562 Pain in left knee: Secondary | ICD-10-CM | POA: Diagnosis not present

## 2016-01-16 DIAGNOSIS — R600 Localized edema: Secondary | ICD-10-CM | POA: Diagnosis not present

## 2016-01-17 ENCOUNTER — Encounter: Payer: Self-pay | Admitting: Adult Health

## 2016-01-17 ENCOUNTER — Non-Acute Institutional Stay: Payer: Commercial Managed Care - HMO | Admitting: Adult Health

## 2016-01-17 DIAGNOSIS — E559 Vitamin D deficiency, unspecified: Secondary | ICD-10-CM

## 2016-01-17 DIAGNOSIS — M6249 Contracture of muscle, multiple sites: Secondary | ICD-10-CM | POA: Diagnosis not present

## 2016-01-17 DIAGNOSIS — D62 Acute posthemorrhagic anemia: Secondary | ICD-10-CM

## 2016-01-17 DIAGNOSIS — R600 Localized edema: Secondary | ICD-10-CM | POA: Diagnosis not present

## 2016-01-17 DIAGNOSIS — J309 Allergic rhinitis, unspecified: Secondary | ICD-10-CM

## 2016-01-17 DIAGNOSIS — Z96652 Presence of left artificial knee joint: Secondary | ICD-10-CM | POA: Diagnosis not present

## 2016-01-17 DIAGNOSIS — M1712 Unilateral primary osteoarthritis, left knee: Secondary | ICD-10-CM

## 2016-01-17 DIAGNOSIS — M25562 Pain in left knee: Secondary | ICD-10-CM | POA: Diagnosis not present

## 2016-01-17 DIAGNOSIS — R2681 Unsteadiness on feet: Secondary | ICD-10-CM | POA: Diagnosis not present

## 2016-01-17 DIAGNOSIS — K5901 Slow transit constipation: Secondary | ICD-10-CM | POA: Diagnosis not present

## 2016-01-17 DIAGNOSIS — M25662 Stiffness of left knee, not elsewhere classified: Secondary | ICD-10-CM | POA: Diagnosis not present

## 2016-01-17 DIAGNOSIS — M6281 Muscle weakness (generalized): Secondary | ICD-10-CM | POA: Diagnosis not present

## 2016-01-17 DIAGNOSIS — Z4733 Aftercare following explantation of knee joint prosthesis: Secondary | ICD-10-CM | POA: Diagnosis not present

## 2016-01-17 NOTE — Progress Notes (Signed)
Patient ID: [name], female   DOB: 04-11-1935, 81 y.o.   MRN: [id number]    DATE:  01/17/16  MRN:  [id number]  BIRTHDAY: 03/03/1935  Facility:  Nursing Home Location:  [place] Room Number: 1202-P  LEVEL OF CARE:  SNF (31)  Contact Information    Name Needles Daughter [phone]    [facility] Daughter [phone]         Code Status History        Advance Directive Documentation        Most Recent Value   Type of Advance Directive  Out of facility DNR (pink MOST or yellow form)   Pre-existing out of facility DNR order (yellow form or pink MOST form)     "MOST" Form in Place?         Chief Complaint  Patient presents with  . Discharge Note    HISTORY OF PRESENT ILLNESS:  This is a 81 year old female who is for discharge home with Home health OT for ADLs, PT for endurance and skilled Nurse for wound care. DME: Standard rolling walker and 3 in one commode. She has been admitted to [facility] on 01/06/16 from [facility]. She has PMH of hyperlipidemia, RA, fibromyalgia, lumbar DDD, vertigo and diverticulosis. She has osteoarthritis of left knee for which she had left total knee arthroplasty on 01/03/16.   Patient was admitted to this facility for short-term rehabilitation after the patient's recent hospitalization.  Patient has completed SNF rehabilitation and therapy has cleared the patient for discharge.   PAST MEDICAL HISTORY:  Past Medical History  Diagnosis Date  . Hyperlipidemia     doesn't take any meds  . Rheumatoid arthritis(714.0) 12/09/2010  . LOC OSTEOARTHROS NOT SPEC PRIM/SEC UNSPEC SITE 12/09/2010  . DEGENERATIVE DISC DISEASE, AXIAL SKELETON 12/09/2010  . Fibromyalgia   . DDD (degenerative disc disease), lumbar   . History of bronchitis     several yrs ago  . Pneumonia     hx of-many yrs ago  . Vertigo     rarely  . Numbness     right pointer and middle  finger.Left middle  . Osteoarthritis   . Joint pain   . Back pain     DDD  . Osteoporosis   . GERD     not on any meds  . Gastritis   . History of colon polyps     precancerous  . Diverticulosis   . Urinary frequency   . Nocturia   . Hemorrhoids     internal  . Heart attack ([place])   . Obesity   . HTN (hypertension)   . Cancer ([place])   . Stroke ([place])   . Diabetes ([place])      CURRENT MEDICATIONS: Reviewed  Patient's Medications  New Prescriptions   No medications on file  Previous Medications   ASCORBIC ACID (VITAMIN C) 500 MG TABLET    Take 500 mg by mouth 3 (three) times daily.    ASPIRIN EC 325 MG TABLET    Take 1 tablet (325 mg total) by mouth 2 (two) times daily after a meal. Take x 1 month post op to decrease risk of blood clots.   B COMPLEX VITAMINS TABLET    Take 1 tablet by mouth daily.     BACLOFEN (LIORESAL) 10 MG TABLET    Take 1 tablet (10 mg total) by mouth every 8 (eight) hours as  needed for muscle spasms.   CALCIUM CARBONATE (CALCIUM 600 PO)    Take 1,200 mg by mouth 2 (two) times daily.    CHOLECALCIFEROL (VITAMIN D-3) 1000 UNITS CAPS    Take 1 capsule by mouth daily.   COENZYME Q10 (CO Q-10) 100 MG CAPS    Take 100 mg by mouth daily.   FISH OIL-OMEGA-3 FATTY ACIDS 1000 MG CAPSULE    Take 1 g by mouth. Give 1 capsule PO with breakfast, lunch, dinner   IBUPROFEN (ADVIL,MOTRIN) 200 MG TABLET    Take 200 mg by mouth every 8 (eight) hours as needed. Give 2 tabs = 400 mg   KETOTIFEN (ZADITOR) 0.025 % OPHTHALMIC SOLUTION    Place 2 drops into both eyes 2 (two) times daily.   LACTOBACILLUS (ACIDOPHILUS PO)    Take 1 tablet by mouth every evening.   LORATADINE (CLARITIN) 10 MG TABLET    Take 10 mg by mouth daily as needed for allergies. Reported on 12/20/2015   MAGNESIUM 250 MG TABS    Take 1 tablet by mouth 3 (three) times daily.    MENTHOL, TOPICAL ANALGESIC, (BIOFREEZE EX)    Apply topically. To left knee at HS.   MILK THISTLE 175 MG TABLET    Take 175 mg by mouth 2  (two) times daily.   MULTIPLE VITAMINS-MINERALS (CERTAVITE SENIOR/ANTIOXIDANT PO)    Take 1 tablet by mouth daily.   OXYCODONE-ACETAMINOPHEN (PERCOCET/ROXICET) 5-325 MG TABLET    Take 1-2 tablets by mouth every 6 (six) hours as needed for severe pain. DO NOT EXCEED 4GM OF APAP IN 24 HOURS FROM ALL SOURCES   PAPAYA TABS    Take 4 tablets by mouth 3 (three) times daily.   SENNOSIDES-DOCUSATE SODIUM (SENOKOT-S) 8.6-50 MG TABLET    Take 1 tablet by mouth at bedtime.   UNABLE TO FIND    Med Name: Varigone 1 tab po PRN for hemorrhoids   WHEAT DEXTRIN (BENEFIBER PO)    Take 1 Dose by mouth daily.  Modified Medications   No medications on file  Discontinued Medications   ERGOCALCIFEROL (VITAMIN D2) 2000 UNITS TABS    Take 1 tablet by mouth daily.     Allergies  Allergen Reactions  . Codeine   . Naproxen Hypertension  . Esomeprazole Magnesium Other (See Comments)    Abdominal pain  . Statins Other (See Comments)    Myalgia and stomach pain  . Welchol [Colesevelam Hcl] Other (See Comments)    Myalgia and constipation     REVIEW OF SYSTEMS:  GENERAL: no change in appetite, no fatigue, no weight changes, no fever, chills or weakness EYES: Denies change in vision, dry eyes, eye pain, itching or discharge EARS: Denies change in hearing, ringing in ears, or earache NOSE: Denies nasal congestion or epistaxis MOUTH and THROAT: Denies oral discomfort, gingival pain or bleeding, pain from teeth or hoarseness   RESPIRATORY: no cough, SOB, DOE, wheezing, hemoptysis CARDIAC: no chest pain, edema or palpitations GI: no abdominal pain, diarrhea, constipation, heart burn, nausea or vomiting GU: Denies dysuria, frequency, hematuria, incontinence, or discharge PSYCHIATRIC: Denies feeling of depression or anxiety. No report of hallucinations, insomnia, paranoia, or agitation   PHYSICAL EXAMINATION  GENERAL APPEARANCE: Well nourished. In no acute distress. Normal body habitus SKIN:  Left knee surgical  incision has steri-strips and dry dressing, dry, no erythema  HEAD: Normal in size and contour. No evidence of trauma EYES: Lids open and close normally. No blepharitis, entropion or ectropion. PERRL. Conjunctivae are  clear and sclerae are white. Lenses are without opacity EARS: Pinnae are normal. Patient hears normal voice tunes of the examiner MOUTH and THROAT: Lips are without lesions. Oral mucosa is moist and without lesions. Tongue is normal in shape, size, and color and without lesions NECK: supple, trachea midline, no neck masses, no thyroid tenderness, no thyromegaly LYMPHATICS: no LAN in the neck, no supraclavicular LAN RESPIRATORY: breathing is even & unlabored, BS CTAB CARDIAC: RRR, no murmur,no extra heart sounds, no edema GI: abdomen soft, normal BS, no masses, no tenderness, no hepatomegaly, no splenomegaly EXTREMITIES:  able to move 4 extremities PSYCHIATRIC: Alert and oriented X 3. Affect and behavior are appropriate  LABS/RADIOLOGY: Labs reviewed: Basic Metabolic Panel:  Recent Labs  01/03/16 0916 01/04/16 0509 01/08/16  NA 140 137 141  K 4.7 3.8 4.7  CL 107 104  --   CO2 24 24  --   GLUCOSE 101* 128*  --   BUN 14 15 14   CREATININE 0.94 1.04* 0.8  CALCIUM 9.7 8.4*  --    CBC:  Recent Labs  12/23/15 0945 01/04/16 0509 01/05/16 0402 01/06/16 0248 01/08/16  WBC 7.2 7.6 10.2 8.7 5.6  NEUTROABS 4.1  --   --   --  3  HGB 13.3 11.2* 10.7* 10.6* 10.9*  HCT 39.4 33.0* 31.2* 30.8* 33*  MCV 94.9 94.8 94.0 94.5  --   PLT 274 238 239 246 326     Dg Chest 2 View  12/23/2015  CLINICAL DATA:  Preoperative examination prior to left total knee arthroplasty EXAM: CHEST  2 VIEW COMPARISON:  PA and lateral chest x-ray of July 25, 2015 FINDINGS: The lungs are mildly hyperinflated. There is no focal infiltrate. There is no pleural effusion. There are stable calcified nodules in the upper lobes. The heart and pulmonary vascularity are normal. There is mild tortuosity of  the descending thoracic aorta. There are degenerative changes of both shoulders. The thoracic spine exhibits mild degenerative disc change at multiple levels. IMPRESSION: COPD. Evidence of previous granulomatous infection. There is no active cardiopulmonary disease. Electronically Signed   By: David  Martinique M.D.   On: 12/23/2015 11:46    ASSESSMENT/PLAN:  Osteoarthritis S/P left total knee arthroplasty - for Home health PT, OT and skilled Nurse; continue aspirin 325 mg 1 tab by mouth twice a day 2 weeks for DVT prophylaxis; baclofen 10 mg 1 tab by mouth every 8 hours when necessary for muscle spasm; Percocet 5/325 mg 1-2 tabs by mouth every 6 hours when necessary, Biofreeze to left knee @ HS and Ibuprofen 200 mg take 2 tabs PO Q 8 hours PRN for pain; LLE WBAT; follow-up with Dr. Dorna Leitz, orthopedic surgeon  Vitamin D deficiency - continue vitamin D3 1000 units take 2 tabs = 2000 units daily  Allergic rhinitis - continue loratadine 10 mg 1 tab by mouth daily when necessary  Anemia, acute blood loss - hemoglobin 10.6; re-check 10.9, stable  Constipation - continue magnesium 250 mg 1 tab by mouth 3 times a day , Senokot  8.6 mg 1 tab PO Q HSand Benefiber 1 packet by mouth daily       I have filled out patient's discharge paperwork and written prescriptions.  Patient will receive home health PT, OT and skilled Nurse.  DME provided:  Standard rolling walker and 3 in 1 commode.  Total discharge time: Greater than 30 minutes  Discharge time involved coordination of the discharge process with social worker, nursing staff and therapy department.  Medical justification for home health services/DME verified.      [place], NP [employer] [phone]

## 2016-01-31 DIAGNOSIS — M25562 Pain in left knee: Secondary | ICD-10-CM | POA: Diagnosis not present

## 2016-01-31 DIAGNOSIS — M25662 Stiffness of left knee, not elsewhere classified: Secondary | ICD-10-CM | POA: Diagnosis not present

## 2016-01-31 DIAGNOSIS — R262 Difficulty in walking, not elsewhere classified: Secondary | ICD-10-CM | POA: Diagnosis not present

## 2016-01-31 DIAGNOSIS — Z96652 Presence of left artificial knee joint: Secondary | ICD-10-CM | POA: Diagnosis not present

## 2016-01-31 IMAGING — NM NM MISC PROCEDURE
6 series · 36 of 36 positions shown · non-contrast
Comparison: none

[Series 1: wbr_s-card_st stress-sum-em · 6.4mm · 6.40mm/px · 6 of 20 frames shown]
[frame 2/20]
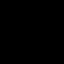
[frame 5/20]
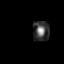
[frame 9/20]
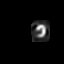
[frame 12/20]
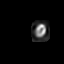
[frame 15/20]
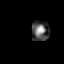
[frame 19/20]
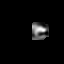

[Series 1: stress-gsp · 6.40mm/px · 6 of 512 frames shown]
[frame 43/512]
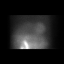
[frame 128/512]
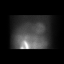
[frame 214/512]
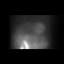
[frame 299/512]
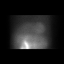
[frame 384/512]
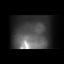
[frame 470/512]
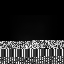

[Series 1: wbr_r-card_st rest · 6.4mm · 6.40mm/px · 6 of 21 frames shown]
[frame 2/21]
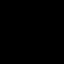
[frame 6/21]
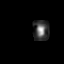
[frame 9/21]
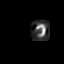
[frame 13/21]
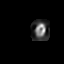
[frame 16/21]
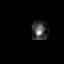
[frame 20/21]
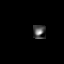

[Series 1: stress-sum-em · 6.40mm/px · 6 of 64 frames shown]
[frame 6/64]
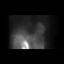
[frame 16/64]
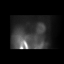
[frame 27/64]
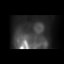
[frame 38/64]
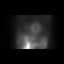
[frame 48/64]
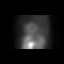
[frame 59/64]
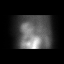

[Series 1: rest · 6.40mm/px · 6 of 64 frames shown]
[frame 6/64]
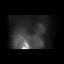
[frame 16/64]
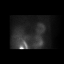
[frame 27/64]
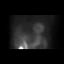
[frame 38/64]
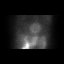
[frame 48/64]
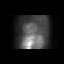
[frame 59/64]
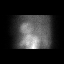

[Series 1: wbr_s-card_st stress-gsp · 6.4mm · 6.40mm/px · 6 of 163 frames shown]
[frame 14/163]
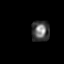
[frame 41/163]
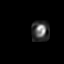
[frame 68/163]
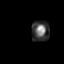
[frame 95/163]
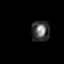
[frame 122/163]
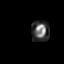
[frame 150/163]
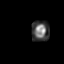

[36 of 36 positions shown; findings below may reference images not displayed]

Canned report from images found in remote index.

Refer to host system for actual result text.

## 2016-02-04 DIAGNOSIS — Z96652 Presence of left artificial knee joint: Secondary | ICD-10-CM | POA: Diagnosis not present

## 2016-02-04 DIAGNOSIS — M25662 Stiffness of left knee, not elsewhere classified: Secondary | ICD-10-CM | POA: Diagnosis not present

## 2016-02-04 DIAGNOSIS — M25562 Pain in left knee: Secondary | ICD-10-CM | POA: Diagnosis not present

## 2016-02-04 DIAGNOSIS — R262 Difficulty in walking, not elsewhere classified: Secondary | ICD-10-CM | POA: Diagnosis not present

## 2016-02-07 DIAGNOSIS — Z96652 Presence of left artificial knee joint: Secondary | ICD-10-CM | POA: Diagnosis not present

## 2016-02-07 DIAGNOSIS — R262 Difficulty in walking, not elsewhere classified: Secondary | ICD-10-CM | POA: Diagnosis not present

## 2016-02-07 DIAGNOSIS — M25562 Pain in left knee: Secondary | ICD-10-CM | POA: Diagnosis not present

## 2016-02-07 DIAGNOSIS — M25662 Stiffness of left knee, not elsewhere classified: Secondary | ICD-10-CM | POA: Diagnosis not present

## 2016-02-11 DIAGNOSIS — R262 Difficulty in walking, not elsewhere classified: Secondary | ICD-10-CM | POA: Diagnosis not present

## 2016-02-11 DIAGNOSIS — M25662 Stiffness of left knee, not elsewhere classified: Secondary | ICD-10-CM | POA: Diagnosis not present

## 2016-02-11 DIAGNOSIS — Z96652 Presence of left artificial knee joint: Secondary | ICD-10-CM | POA: Diagnosis not present

## 2016-02-11 DIAGNOSIS — M25562 Pain in left knee: Secondary | ICD-10-CM | POA: Diagnosis not present

## 2016-02-13 DIAGNOSIS — Z96652 Presence of left artificial knee joint: Secondary | ICD-10-CM | POA: Diagnosis not present

## 2016-02-13 DIAGNOSIS — M25562 Pain in left knee: Secondary | ICD-10-CM | POA: Diagnosis not present

## 2016-02-13 DIAGNOSIS — R262 Difficulty in walking, not elsewhere classified: Secondary | ICD-10-CM | POA: Diagnosis not present

## 2016-02-13 DIAGNOSIS — M25662 Stiffness of left knee, not elsewhere classified: Secondary | ICD-10-CM | POA: Diagnosis not present

## 2016-02-18 DIAGNOSIS — M25562 Pain in left knee: Secondary | ICD-10-CM | POA: Diagnosis not present

## 2016-02-18 DIAGNOSIS — Z96652 Presence of left artificial knee joint: Secondary | ICD-10-CM | POA: Diagnosis not present

## 2016-02-18 DIAGNOSIS — M25662 Stiffness of left knee, not elsewhere classified: Secondary | ICD-10-CM | POA: Diagnosis not present

## 2016-02-18 DIAGNOSIS — R262 Difficulty in walking, not elsewhere classified: Secondary | ICD-10-CM | POA: Diagnosis not present

## 2016-02-20 DIAGNOSIS — M25662 Stiffness of left knee, not elsewhere classified: Secondary | ICD-10-CM | POA: Diagnosis not present

## 2016-02-20 DIAGNOSIS — Z96652 Presence of left artificial knee joint: Secondary | ICD-10-CM | POA: Diagnosis not present

## 2016-02-20 DIAGNOSIS — R262 Difficulty in walking, not elsewhere classified: Secondary | ICD-10-CM | POA: Diagnosis not present

## 2016-02-20 DIAGNOSIS — M25562 Pain in left knee: Secondary | ICD-10-CM | POA: Diagnosis not present

## 2016-02-26 DIAGNOSIS — Z0001 Encounter for general adult medical examination with abnormal findings: Secondary | ICD-10-CM | POA: Diagnosis not present

## 2016-02-26 DIAGNOSIS — E559 Vitamin D deficiency, unspecified: Secondary | ICD-10-CM | POA: Diagnosis not present

## 2016-02-26 DIAGNOSIS — J309 Allergic rhinitis, unspecified: Secondary | ICD-10-CM | POA: Diagnosis not present

## 2016-02-26 DIAGNOSIS — M150 Primary generalized (osteo)arthritis: Secondary | ICD-10-CM | POA: Diagnosis not present

## 2016-02-26 DIAGNOSIS — E785 Hyperlipidemia, unspecified: Secondary | ICD-10-CM | POA: Diagnosis not present

## 2016-02-27 DIAGNOSIS — M25562 Pain in left knee: Secondary | ICD-10-CM | POA: Diagnosis not present

## 2016-04-09 DIAGNOSIS — M25562 Pain in left knee: Secondary | ICD-10-CM | POA: Diagnosis not present

## 2016-04-09 DIAGNOSIS — Z96652 Presence of left artificial knee joint: Secondary | ICD-10-CM | POA: Diagnosis not present

## 2016-05-19 DIAGNOSIS — M5416 Radiculopathy, lumbar region: Secondary | ICD-10-CM | POA: Diagnosis not present

## 2016-05-19 DIAGNOSIS — M5441 Lumbago with sciatica, right side: Secondary | ICD-10-CM | POA: Diagnosis not present

## 2016-05-21 DIAGNOSIS — M5416 Radiculopathy, lumbar region: Secondary | ICD-10-CM | POA: Diagnosis not present

## 2016-07-09 DIAGNOSIS — M19042 Primary osteoarthritis, left hand: Secondary | ICD-10-CM | POA: Diagnosis not present

## 2016-07-09 DIAGNOSIS — M1711 Unilateral primary osteoarthritis, right knee: Secondary | ICD-10-CM | POA: Diagnosis not present

## 2016-07-09 DIAGNOSIS — Z09 Encounter for follow-up examination after completed treatment for conditions other than malignant neoplasm: Secondary | ICD-10-CM | POA: Diagnosis not present

## 2016-07-09 DIAGNOSIS — M25562 Pain in left knee: Secondary | ICD-10-CM | POA: Diagnosis not present

## 2016-07-09 DIAGNOSIS — Z96652 Presence of left artificial knee joint: Secondary | ICD-10-CM | POA: Diagnosis not present

## 2016-07-09 DIAGNOSIS — M19041 Primary osteoarthritis, right hand: Secondary | ICD-10-CM | POA: Diagnosis not present

## 2016-07-13 DIAGNOSIS — H5210 Myopia, unspecified eye: Secondary | ICD-10-CM | POA: Diagnosis not present

## 2016-07-13 DIAGNOSIS — H524 Presbyopia: Secondary | ICD-10-CM | POA: Diagnosis not present

## 2016-07-31 DIAGNOSIS — H33312 Horseshoe tear of retina without detachment, left eye: Secondary | ICD-10-CM | POA: Diagnosis not present

## 2016-07-31 DIAGNOSIS — H35411 Lattice degeneration of retina, right eye: Secondary | ICD-10-CM | POA: Diagnosis not present

## 2016-07-31 DIAGNOSIS — H35372 Puckering of macula, left eye: Secondary | ICD-10-CM | POA: Diagnosis not present

## 2016-07-31 DIAGNOSIS — H33322 Round hole, left eye: Secondary | ICD-10-CM | POA: Diagnosis not present

## 2016-08-06 DIAGNOSIS — Z23 Encounter for immunization: Secondary | ICD-10-CM | POA: Diagnosis not present

## 2016-08-19 DIAGNOSIS — M25531 Pain in right wrist: Secondary | ICD-10-CM | POA: Diagnosis not present

## 2016-08-19 DIAGNOSIS — M79641 Pain in right hand: Secondary | ICD-10-CM | POA: Diagnosis not present

## 2016-08-25 DIAGNOSIS — M79641 Pain in right hand: Secondary | ICD-10-CM | POA: Diagnosis not present

## 2016-08-25 DIAGNOSIS — M25531 Pain in right wrist: Secondary | ICD-10-CM | POA: Diagnosis not present

## 2016-08-27 DIAGNOSIS — Z0100 Encounter for examination of eyes and vision without abnormal findings: Secondary | ICD-10-CM | POA: Diagnosis not present

## 2016-09-24 DIAGNOSIS — M25531 Pain in right wrist: Secondary | ICD-10-CM | POA: Diagnosis not present

## 2016-09-24 DIAGNOSIS — M654 Radial styloid tenosynovitis [de Quervain]: Secondary | ICD-10-CM | POA: Diagnosis not present

## 2016-10-07 DIAGNOSIS — M79641 Pain in right hand: Secondary | ICD-10-CM | POA: Diagnosis not present

## 2016-10-07 DIAGNOSIS — M79642 Pain in left hand: Secondary | ICD-10-CM | POA: Diagnosis not present

## 2016-10-20 DIAGNOSIS — M79642 Pain in left hand: Secondary | ICD-10-CM | POA: Diagnosis not present

## 2016-10-20 DIAGNOSIS — M2550 Pain in unspecified joint: Secondary | ICD-10-CM | POA: Diagnosis not present

## 2016-10-20 DIAGNOSIS — M79643 Pain in unspecified hand: Secondary | ICD-10-CM | POA: Diagnosis not present

## 2016-10-20 DIAGNOSIS — M069 Rheumatoid arthritis, unspecified: Secondary | ICD-10-CM | POA: Diagnosis not present

## 2016-10-20 DIAGNOSIS — M1990 Unspecified osteoarthritis, unspecified site: Secondary | ICD-10-CM | POA: Diagnosis not present

## 2017-01-04 DIAGNOSIS — M25562 Pain in left knee: Secondary | ICD-10-CM | POA: Diagnosis not present

## 2017-01-04 DIAGNOSIS — M1711 Unilateral primary osteoarthritis, right knee: Secondary | ICD-10-CM | POA: Diagnosis not present

## 2017-01-04 DIAGNOSIS — M5441 Lumbago with sciatica, right side: Secondary | ICD-10-CM | POA: Diagnosis not present

## 2017-01-04 DIAGNOSIS — M545 Low back pain: Secondary | ICD-10-CM | POA: Diagnosis not present

## 2017-01-20 DIAGNOSIS — R51 Headache: Secondary | ICD-10-CM | POA: Diagnosis not present

## 2017-01-20 DIAGNOSIS — H6120 Impacted cerumen, unspecified ear: Secondary | ICD-10-CM | POA: Diagnosis not present

## 2017-01-20 DIAGNOSIS — R141 Gas pain: Secondary | ICD-10-CM | POA: Diagnosis not present

## 2017-02-13 DIAGNOSIS — H0014 Chalazion left upper eyelid: Secondary | ICD-10-CM | POA: Diagnosis not present

## 2017-02-17 DIAGNOSIS — H00016 Hordeolum externum left eye, unspecified eyelid: Secondary | ICD-10-CM | POA: Diagnosis not present

## 2017-02-23 DIAGNOSIS — H00014 Hordeolum externum left upper eyelid: Secondary | ICD-10-CM | POA: Diagnosis not present

## 2017-02-23 DIAGNOSIS — R21 Rash and other nonspecific skin eruption: Secondary | ICD-10-CM | POA: Diagnosis not present

## 2017-03-01 DIAGNOSIS — H26492 Other secondary cataract, left eye: Secondary | ICD-10-CM | POA: Diagnosis not present

## 2017-03-01 DIAGNOSIS — H01006 Unspecified blepharitis left eye, unspecified eyelid: Secondary | ICD-10-CM | POA: Diagnosis not present

## 2017-03-01 DIAGNOSIS — H01003 Unspecified blepharitis right eye, unspecified eyelid: Secondary | ICD-10-CM | POA: Diagnosis not present

## 2017-03-01 DIAGNOSIS — H0014 Chalazion left upper eyelid: Secondary | ICD-10-CM | POA: Diagnosis not present

## 2017-03-02 DIAGNOSIS — Z0000 Encounter for general adult medical examination without abnormal findings: Secondary | ICD-10-CM | POA: Diagnosis not present

## 2017-03-02 DIAGNOSIS — M810 Age-related osteoporosis without current pathological fracture: Secondary | ICD-10-CM | POA: Diagnosis not present

## 2017-03-02 DIAGNOSIS — Z8639 Personal history of other endocrine, nutritional and metabolic disease: Secondary | ICD-10-CM | POA: Diagnosis not present

## 2017-03-02 DIAGNOSIS — E785 Hyperlipidemia, unspecified: Secondary | ICD-10-CM | POA: Diagnosis not present

## 2017-03-05 DIAGNOSIS — H0014 Chalazion left upper eyelid: Secondary | ICD-10-CM | POA: Diagnosis not present

## 2017-03-16 DIAGNOSIS — H0014 Chalazion left upper eyelid: Secondary | ICD-10-CM | POA: Diagnosis not present

## 2017-04-05 DIAGNOSIS — H527 Unspecified disorder of refraction: Secondary | ICD-10-CM | POA: Diagnosis not present

## 2017-04-05 DIAGNOSIS — H01006 Unspecified blepharitis left eye, unspecified eyelid: Secondary | ICD-10-CM | POA: Diagnosis not present

## 2017-04-05 DIAGNOSIS — H532 Diplopia: Secondary | ICD-10-CM | POA: Diagnosis not present

## 2017-04-05 DIAGNOSIS — H26492 Other secondary cataract, left eye: Secondary | ICD-10-CM | POA: Diagnosis not present

## 2017-04-05 DIAGNOSIS — H01003 Unspecified blepharitis right eye, unspecified eyelid: Secondary | ICD-10-CM | POA: Diagnosis not present

## 2017-04-05 DIAGNOSIS — H33302 Unspecified retinal break, left eye: Secondary | ICD-10-CM | POA: Diagnosis not present

## 2017-04-06 DIAGNOSIS — M545 Low back pain: Secondary | ICD-10-CM | POA: Diagnosis not present

## 2017-04-06 DIAGNOSIS — M4726 Other spondylosis with radiculopathy, lumbar region: Secondary | ICD-10-CM | POA: Diagnosis not present

## 2017-04-29 DIAGNOSIS — M25561 Pain in right knee: Secondary | ICD-10-CM | POA: Diagnosis not present

## 2017-05-04 DIAGNOSIS — M5416 Radiculopathy, lumbar region: Secondary | ICD-10-CM | POA: Diagnosis not present

## 2017-05-12 DIAGNOSIS — H5213 Myopia, bilateral: Secondary | ICD-10-CM | POA: Diagnosis not present

## 2017-05-12 DIAGNOSIS — H532 Diplopia: Secondary | ICD-10-CM | POA: Diagnosis not present

## 2017-05-12 DIAGNOSIS — H26492 Other secondary cataract, left eye: Secondary | ICD-10-CM | POA: Diagnosis not present

## 2017-05-12 DIAGNOSIS — H01003 Unspecified blepharitis right eye, unspecified eyelid: Secondary | ICD-10-CM | POA: Diagnosis not present

## 2017-05-12 DIAGNOSIS — H52209 Unspecified astigmatism, unspecified eye: Secondary | ICD-10-CM | POA: Diagnosis not present

## 2017-05-12 DIAGNOSIS — H524 Presbyopia: Secondary | ICD-10-CM | POA: Diagnosis not present

## 2017-05-12 DIAGNOSIS — H01006 Unspecified blepharitis left eye, unspecified eyelid: Secondary | ICD-10-CM | POA: Diagnosis not present

## 2017-05-17 DIAGNOSIS — M545 Low back pain: Secondary | ICD-10-CM | POA: Diagnosis not present

## 2017-05-18 DIAGNOSIS — M5416 Radiculopathy, lumbar region: Secondary | ICD-10-CM | POA: Diagnosis not present

## 2017-07-13 DIAGNOSIS — M5416 Radiculopathy, lumbar region: Secondary | ICD-10-CM | POA: Diagnosis not present

## 2017-07-13 DIAGNOSIS — M545 Low back pain: Secondary | ICD-10-CM | POA: Diagnosis not present

## 2017-07-16 DIAGNOSIS — M545 Low back pain: Secondary | ICD-10-CM | POA: Diagnosis not present

## 2017-07-16 DIAGNOSIS — M5416 Radiculopathy, lumbar region: Secondary | ICD-10-CM | POA: Diagnosis not present

## 2017-07-19 DIAGNOSIS — D2311 Other benign neoplasm of skin of right eyelid, including canthus: Secondary | ICD-10-CM | POA: Diagnosis not present

## 2017-07-19 DIAGNOSIS — H01003 Unspecified blepharitis right eye, unspecified eyelid: Secondary | ICD-10-CM | POA: Diagnosis not present

## 2017-07-19 DIAGNOSIS — H532 Diplopia: Secondary | ICD-10-CM | POA: Diagnosis not present

## 2017-07-19 DIAGNOSIS — H01006 Unspecified blepharitis left eye, unspecified eyelid: Secondary | ICD-10-CM | POA: Diagnosis not present

## 2017-07-19 DIAGNOSIS — H26492 Other secondary cataract, left eye: Secondary | ICD-10-CM | POA: Diagnosis not present

## 2017-07-20 DIAGNOSIS — M5416 Radiculopathy, lumbar region: Secondary | ICD-10-CM | POA: Diagnosis not present

## 2017-07-20 DIAGNOSIS — M545 Low back pain: Secondary | ICD-10-CM | POA: Diagnosis not present

## 2017-07-23 DIAGNOSIS — M545 Low back pain: Secondary | ICD-10-CM | POA: Diagnosis not present

## 2017-07-23 DIAGNOSIS — M5416 Radiculopathy, lumbar region: Secondary | ICD-10-CM | POA: Diagnosis not present

## 2017-07-27 DIAGNOSIS — M545 Low back pain: Secondary | ICD-10-CM | POA: Diagnosis not present

## 2017-07-27 DIAGNOSIS — M5416 Radiculopathy, lumbar region: Secondary | ICD-10-CM | POA: Diagnosis not present

## 2017-07-29 DIAGNOSIS — M545 Low back pain: Secondary | ICD-10-CM | POA: Diagnosis not present

## 2017-07-29 DIAGNOSIS — M5416 Radiculopathy, lumbar region: Secondary | ICD-10-CM | POA: Diagnosis not present

## 2017-08-03 DIAGNOSIS — M5416 Radiculopathy, lumbar region: Secondary | ICD-10-CM | POA: Diagnosis not present

## 2017-08-03 DIAGNOSIS — M545 Low back pain: Secondary | ICD-10-CM | POA: Diagnosis not present

## 2017-08-05 DIAGNOSIS — M545 Low back pain: Secondary | ICD-10-CM | POA: Diagnosis not present

## 2017-08-05 DIAGNOSIS — M5416 Radiculopathy, lumbar region: Secondary | ICD-10-CM | POA: Diagnosis not present

## 2017-08-10 DIAGNOSIS — Z23 Encounter for immunization: Secondary | ICD-10-CM | POA: Diagnosis not present

## 2017-08-16 DIAGNOSIS — R140 Abdominal distension (gaseous): Secondary | ICD-10-CM | POA: Diagnosis not present

## 2017-08-16 DIAGNOSIS — R143 Flatulence: Secondary | ICD-10-CM | POA: Diagnosis not present

## 2017-08-16 DIAGNOSIS — R159 Full incontinence of feces: Secondary | ICD-10-CM | POA: Diagnosis not present

## 2017-08-16 DIAGNOSIS — K219 Gastro-esophageal reflux disease without esophagitis: Secondary | ICD-10-CM | POA: Diagnosis not present

## 2017-08-17 DIAGNOSIS — M25562 Pain in left knee: Secondary | ICD-10-CM | POA: Diagnosis not present

## 2017-08-17 DIAGNOSIS — M1711 Unilateral primary osteoarthritis, right knee: Secondary | ICD-10-CM | POA: Diagnosis not present

## 2017-09-13 DIAGNOSIS — M4696 Unspecified inflammatory spondylopathy, lumbar region: Secondary | ICD-10-CM | POA: Diagnosis not present

## 2017-09-13 DIAGNOSIS — M545 Low back pain: Secondary | ICD-10-CM | POA: Diagnosis not present

## 2017-09-13 DIAGNOSIS — M5416 Radiculopathy, lumbar region: Secondary | ICD-10-CM | POA: Diagnosis not present

## 2017-09-19 IMAGING — CR DG CHEST 2V
2 series · 2 of 2 positions shown · non-contrast
Comparison: PA and lateral chest x-ray July 25, 2015

CLINICAL DATA: Preoperative examination prior to left total knee
arthroplasty

EXAM:
CHEST  2 VIEW

[w chest pa]
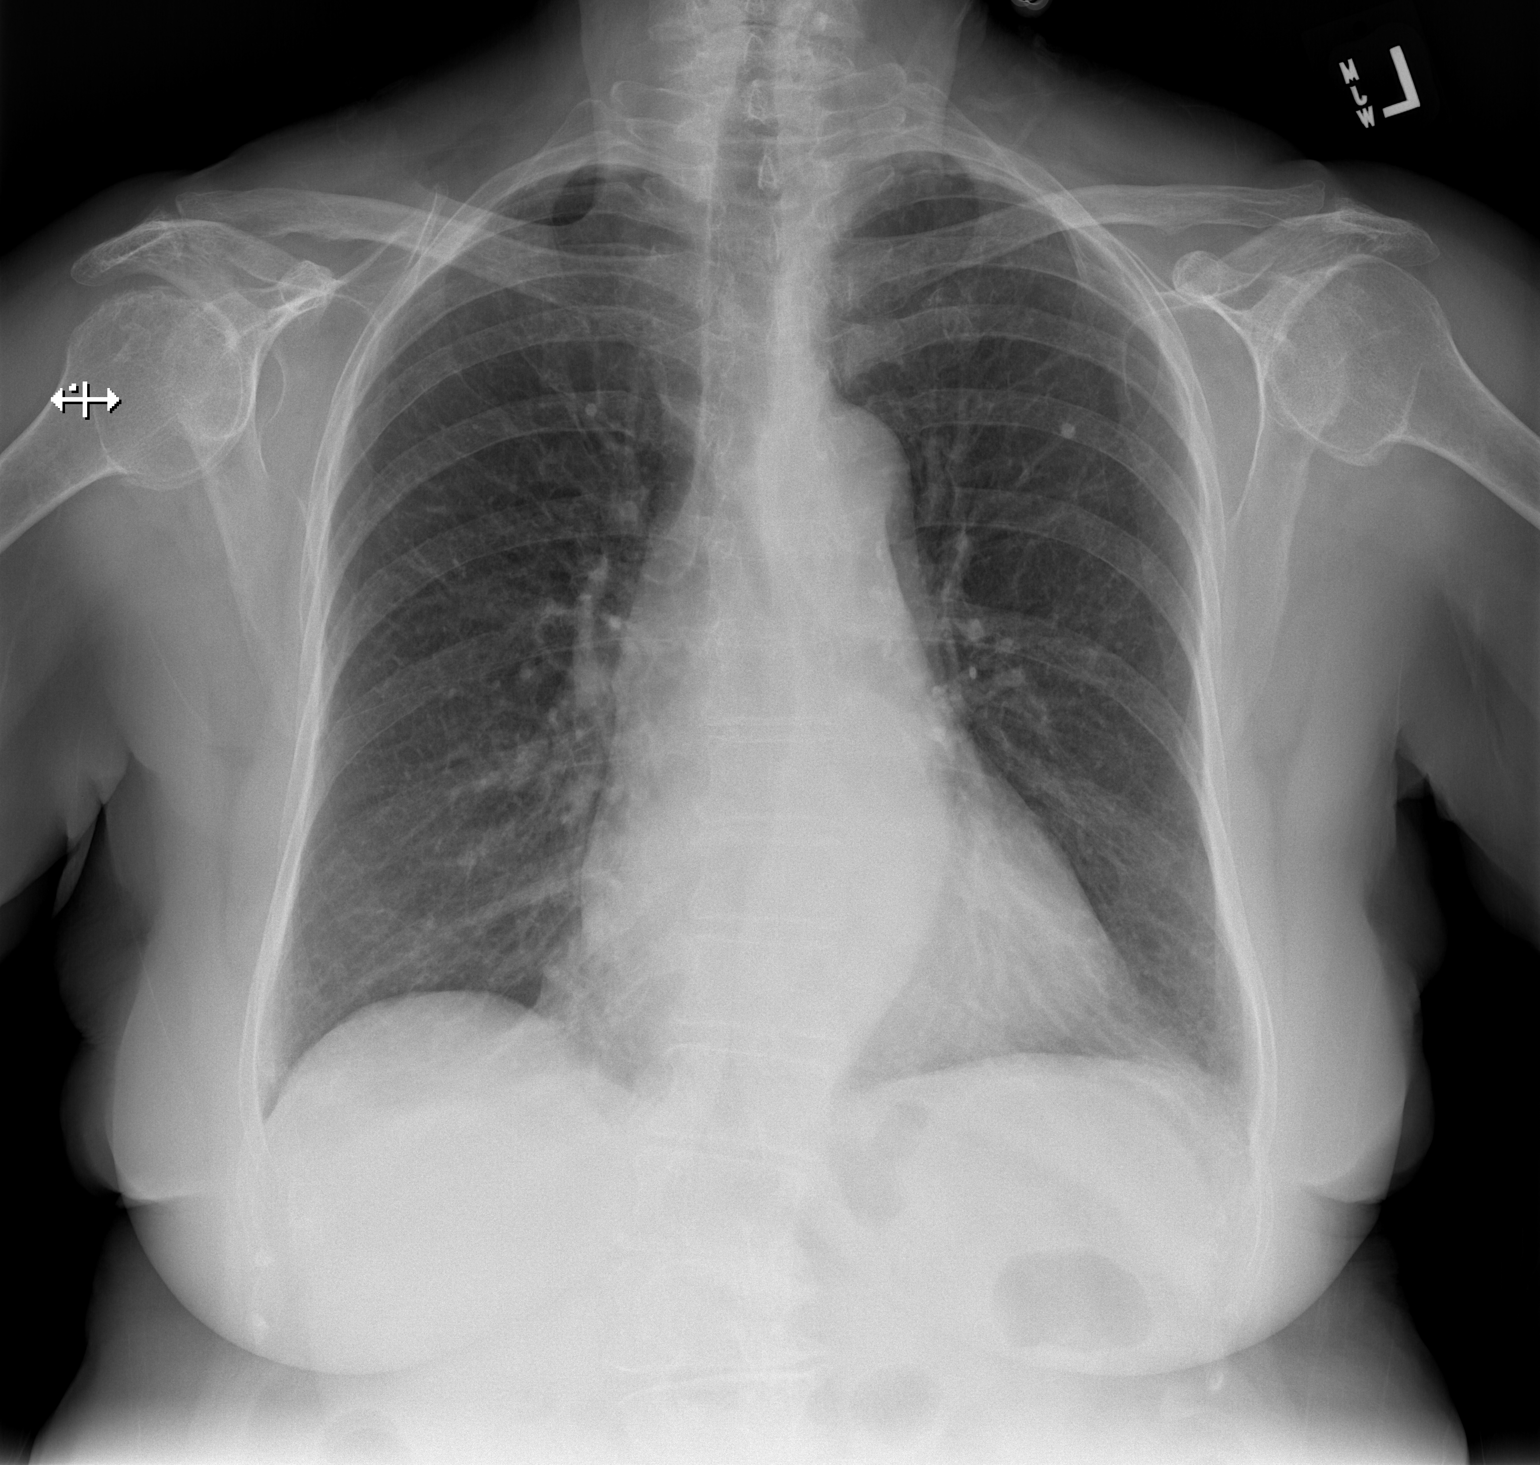

[w chest lat]
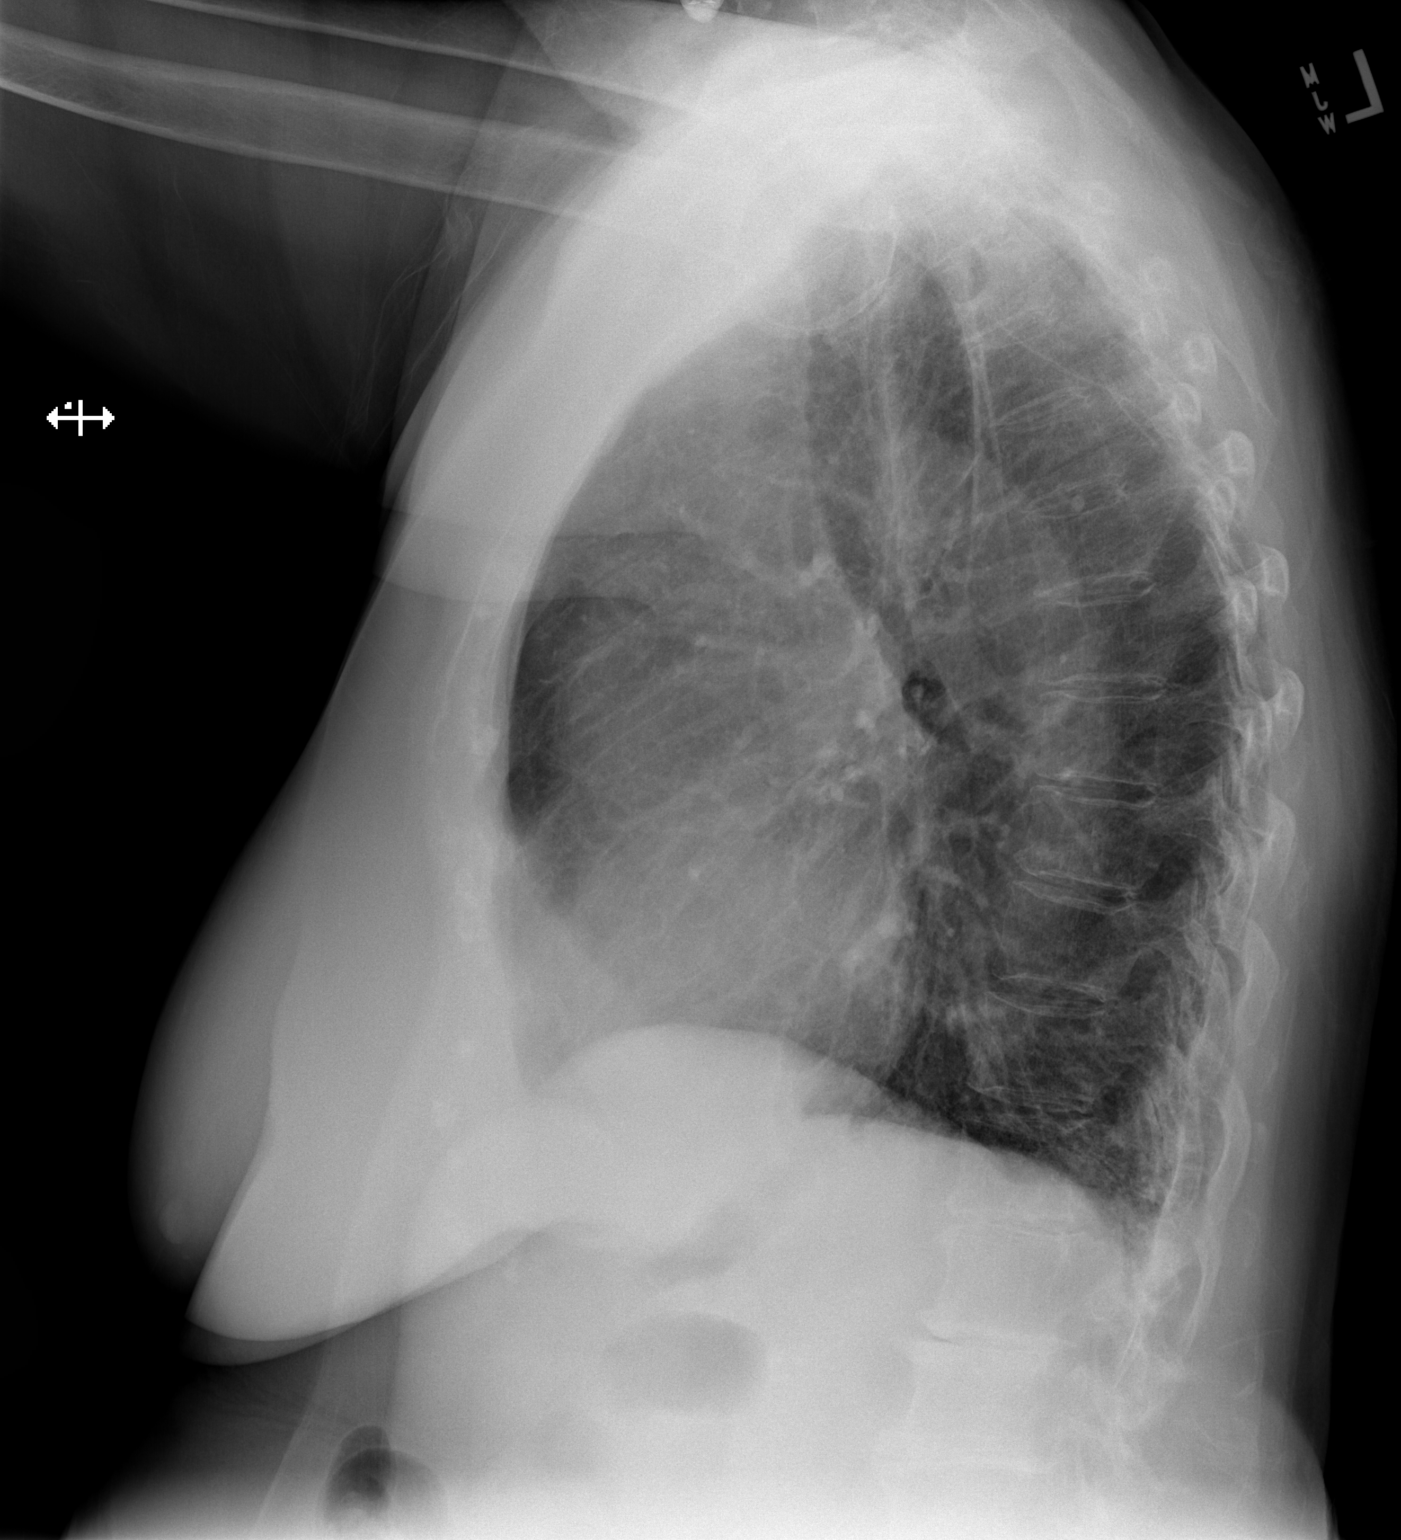

[2 of 2 positions shown; findings below may reference images not displayed]

FINDINGS: The lungs are mildly hyperinflated. There is no focal infiltrate.
There is no pleural effusion. There are stable calcified nodules in
the upper lobes. The heart and pulmonary vascularity are normal.
There is mild tortuosity of the descending thoracic aorta. There are
degenerative changes of both shoulders. The thoracic spine exhibits
mild degenerative disc change at multiple levels.
IMPRESSION: COPD. Evidence of previous granulomatous infection. There is no
active cardiopulmonary disease.

## 2017-09-20 DIAGNOSIS — M5416 Radiculopathy, lumbar region: Secondary | ICD-10-CM | POA: Diagnosis not present

## 2017-09-20 DIAGNOSIS — H6591 Unspecified nonsuppurative otitis media, right ear: Secondary | ICD-10-CM | POA: Diagnosis not present

## 2017-10-20 DIAGNOSIS — M79643 Pain in unspecified hand: Secondary | ICD-10-CM | POA: Diagnosis not present

## 2017-10-20 DIAGNOSIS — M779 Enthesopathy, unspecified: Secondary | ICD-10-CM | POA: Diagnosis not present

## 2017-10-20 DIAGNOSIS — M069 Rheumatoid arthritis, unspecified: Secondary | ICD-10-CM | POA: Diagnosis not present

## 2017-10-20 DIAGNOSIS — M1990 Unspecified osteoarthritis, unspecified site: Secondary | ICD-10-CM | POA: Diagnosis not present

## 2017-10-20 DIAGNOSIS — M25531 Pain in right wrist: Secondary | ICD-10-CM | POA: Diagnosis not present

## 2017-10-20 DIAGNOSIS — M2550 Pain in unspecified joint: Secondary | ICD-10-CM | POA: Diagnosis not present

## 2017-10-20 DIAGNOSIS — Z8739 Personal history of other diseases of the musculoskeletal system and connective tissue: Secondary | ICD-10-CM | POA: Diagnosis not present

## 2017-10-27 DIAGNOSIS — M069 Rheumatoid arthritis, unspecified: Secondary | ICD-10-CM | POA: Diagnosis not present

## 2017-10-27 DIAGNOSIS — Z8739 Personal history of other diseases of the musculoskeletal system and connective tissue: Secondary | ICD-10-CM | POA: Diagnosis not present

## 2017-10-27 DIAGNOSIS — M79643 Pain in unspecified hand: Secondary | ICD-10-CM | POA: Diagnosis not present

## 2017-10-27 DIAGNOSIS — M1990 Unspecified osteoarthritis, unspecified site: Secondary | ICD-10-CM | POA: Diagnosis not present

## 2017-10-27 DIAGNOSIS — M79642 Pain in left hand: Secondary | ICD-10-CM | POA: Diagnosis not present

## 2017-10-27 DIAGNOSIS — M2550 Pain in unspecified joint: Secondary | ICD-10-CM | POA: Diagnosis not present

## 2017-10-27 DIAGNOSIS — M779 Enthesopathy, unspecified: Secondary | ICD-10-CM | POA: Diagnosis not present

## 2017-10-27 DIAGNOSIS — M25511 Pain in right shoulder: Secondary | ICD-10-CM | POA: Diagnosis not present

## 2017-11-15 DIAGNOSIS — Z8673 Personal history of transient ischemic attack (TIA), and cerebral infarction without residual deficits: Secondary | ICD-10-CM | POA: Diagnosis not present

## 2017-11-15 DIAGNOSIS — R51 Headache: Secondary | ICD-10-CM | POA: Diagnosis not present

## 2017-11-15 DIAGNOSIS — R110 Nausea: Secondary | ICD-10-CM | POA: Diagnosis not present

## 2017-11-15 DIAGNOSIS — R269 Unspecified abnormalities of gait and mobility: Secondary | ICD-10-CM | POA: Diagnosis not present

## 2017-11-17 DIAGNOSIS — G44309 Post-traumatic headache, unspecified, not intractable: Secondary | ICD-10-CM | POA: Diagnosis not present

## 2017-11-17 DIAGNOSIS — M62830 Muscle spasm of back: Secondary | ICD-10-CM | POA: Diagnosis not present

## 2017-11-17 DIAGNOSIS — M9903 Segmental and somatic dysfunction of lumbar region: Secondary | ICD-10-CM | POA: Diagnosis not present

## 2017-11-17 DIAGNOSIS — M5417 Radiculopathy, lumbosacral region: Secondary | ICD-10-CM | POA: Diagnosis not present

## 2017-11-17 DIAGNOSIS — M9901 Segmental and somatic dysfunction of cervical region: Secondary | ICD-10-CM | POA: Diagnosis not present

## 2017-11-17 DIAGNOSIS — M545 Low back pain: Secondary | ICD-10-CM | POA: Diagnosis not present

## 2017-11-25 DIAGNOSIS — M9901 Segmental and somatic dysfunction of cervical region: Secondary | ICD-10-CM | POA: Diagnosis not present

## 2017-11-25 DIAGNOSIS — M62830 Muscle spasm of back: Secondary | ICD-10-CM | POA: Diagnosis not present

## 2017-11-25 DIAGNOSIS — M545 Low back pain: Secondary | ICD-10-CM | POA: Diagnosis not present

## 2017-11-25 DIAGNOSIS — M9903 Segmental and somatic dysfunction of lumbar region: Secondary | ICD-10-CM | POA: Diagnosis not present

## 2017-11-25 DIAGNOSIS — M5417 Radiculopathy, lumbosacral region: Secondary | ICD-10-CM | POA: Diagnosis not present

## 2017-11-25 DIAGNOSIS — G44309 Post-traumatic headache, unspecified, not intractable: Secondary | ICD-10-CM | POA: Diagnosis not present

## 2017-11-26 DIAGNOSIS — M9903 Segmental and somatic dysfunction of lumbar region: Secondary | ICD-10-CM | POA: Diagnosis not present

## 2017-11-26 DIAGNOSIS — M545 Low back pain: Secondary | ICD-10-CM | POA: Diagnosis not present

## 2017-11-26 DIAGNOSIS — M9901 Segmental and somatic dysfunction of cervical region: Secondary | ICD-10-CM | POA: Diagnosis not present

## 2017-11-26 DIAGNOSIS — M5417 Radiculopathy, lumbosacral region: Secondary | ICD-10-CM | POA: Diagnosis not present

## 2017-11-26 DIAGNOSIS — G44309 Post-traumatic headache, unspecified, not intractable: Secondary | ICD-10-CM | POA: Diagnosis not present

## 2017-11-26 DIAGNOSIS — M62830 Muscle spasm of back: Secondary | ICD-10-CM | POA: Diagnosis not present

## 2017-11-29 DIAGNOSIS — M9903 Segmental and somatic dysfunction of lumbar region: Secondary | ICD-10-CM | POA: Diagnosis not present

## 2017-11-29 DIAGNOSIS — M9901 Segmental and somatic dysfunction of cervical region: Secondary | ICD-10-CM | POA: Diagnosis not present

## 2017-11-29 DIAGNOSIS — M62830 Muscle spasm of back: Secondary | ICD-10-CM | POA: Diagnosis not present

## 2017-11-29 DIAGNOSIS — M5417 Radiculopathy, lumbosacral region: Secondary | ICD-10-CM | POA: Diagnosis not present

## 2017-11-29 DIAGNOSIS — M545 Low back pain: Secondary | ICD-10-CM | POA: Diagnosis not present

## 2017-11-29 DIAGNOSIS — G44309 Post-traumatic headache, unspecified, not intractable: Secondary | ICD-10-CM | POA: Diagnosis not present

## 2017-12-02 DIAGNOSIS — M545 Low back pain: Secondary | ICD-10-CM | POA: Diagnosis not present

## 2017-12-02 DIAGNOSIS — M5417 Radiculopathy, lumbosacral region: Secondary | ICD-10-CM | POA: Diagnosis not present

## 2017-12-02 DIAGNOSIS — M9903 Segmental and somatic dysfunction of lumbar region: Secondary | ICD-10-CM | POA: Diagnosis not present

## 2017-12-02 DIAGNOSIS — G44309 Post-traumatic headache, unspecified, not intractable: Secondary | ICD-10-CM | POA: Diagnosis not present

## 2017-12-02 DIAGNOSIS — M62830 Muscle spasm of back: Secondary | ICD-10-CM | POA: Diagnosis not present

## 2017-12-02 DIAGNOSIS — M9901 Segmental and somatic dysfunction of cervical region: Secondary | ICD-10-CM | POA: Diagnosis not present

## 2017-12-06 DIAGNOSIS — M9903 Segmental and somatic dysfunction of lumbar region: Secondary | ICD-10-CM | POA: Diagnosis not present

## 2017-12-06 DIAGNOSIS — M62830 Muscle spasm of back: Secondary | ICD-10-CM | POA: Diagnosis not present

## 2017-12-06 DIAGNOSIS — G44309 Post-traumatic headache, unspecified, not intractable: Secondary | ICD-10-CM | POA: Diagnosis not present

## 2017-12-06 DIAGNOSIS — M5417 Radiculopathy, lumbosacral region: Secondary | ICD-10-CM | POA: Diagnosis not present

## 2017-12-06 DIAGNOSIS — M9901 Segmental and somatic dysfunction of cervical region: Secondary | ICD-10-CM | POA: Diagnosis not present

## 2017-12-06 DIAGNOSIS — M545 Low back pain: Secondary | ICD-10-CM | POA: Diagnosis not present

## 2017-12-09 DIAGNOSIS — G44309 Post-traumatic headache, unspecified, not intractable: Secondary | ICD-10-CM | POA: Diagnosis not present

## 2017-12-09 DIAGNOSIS — M9903 Segmental and somatic dysfunction of lumbar region: Secondary | ICD-10-CM | POA: Diagnosis not present

## 2017-12-09 DIAGNOSIS — M545 Low back pain: Secondary | ICD-10-CM | POA: Diagnosis not present

## 2017-12-09 DIAGNOSIS — M9901 Segmental and somatic dysfunction of cervical region: Secondary | ICD-10-CM | POA: Diagnosis not present

## 2017-12-09 DIAGNOSIS — M5417 Radiculopathy, lumbosacral region: Secondary | ICD-10-CM | POA: Diagnosis not present

## 2017-12-09 DIAGNOSIS — M62830 Muscle spasm of back: Secondary | ICD-10-CM | POA: Diagnosis not present

## 2017-12-22 DIAGNOSIS — M5417 Radiculopathy, lumbosacral region: Secondary | ICD-10-CM | POA: Diagnosis not present

## 2017-12-22 DIAGNOSIS — M9903 Segmental and somatic dysfunction of lumbar region: Secondary | ICD-10-CM | POA: Diagnosis not present

## 2017-12-22 DIAGNOSIS — G44309 Post-traumatic headache, unspecified, not intractable: Secondary | ICD-10-CM | POA: Diagnosis not present

## 2017-12-22 DIAGNOSIS — M62830 Muscle spasm of back: Secondary | ICD-10-CM | POA: Diagnosis not present

## 2017-12-22 DIAGNOSIS — M545 Low back pain: Secondary | ICD-10-CM | POA: Diagnosis not present

## 2017-12-22 DIAGNOSIS — M9901 Segmental and somatic dysfunction of cervical region: Secondary | ICD-10-CM | POA: Diagnosis not present

## 2017-12-28 DIAGNOSIS — R51 Headache: Secondary | ICD-10-CM | POA: Diagnosis not present

## 2018-01-05 DIAGNOSIS — M9901 Segmental and somatic dysfunction of cervical region: Secondary | ICD-10-CM | POA: Diagnosis not present

## 2018-01-05 DIAGNOSIS — M62830 Muscle spasm of back: Secondary | ICD-10-CM | POA: Diagnosis not present

## 2018-01-05 DIAGNOSIS — M5417 Radiculopathy, lumbosacral region: Secondary | ICD-10-CM | POA: Diagnosis not present

## 2018-01-05 DIAGNOSIS — M9903 Segmental and somatic dysfunction of lumbar region: Secondary | ICD-10-CM | POA: Diagnosis not present

## 2018-01-05 DIAGNOSIS — G44309 Post-traumatic headache, unspecified, not intractable: Secondary | ICD-10-CM | POA: Diagnosis not present

## 2018-01-05 DIAGNOSIS — M545 Low back pain: Secondary | ICD-10-CM | POA: Diagnosis not present

## 2018-01-20 DIAGNOSIS — M1711 Unilateral primary osteoarthritis, right knee: Secondary | ICD-10-CM | POA: Diagnosis not present

## 2018-01-27 DIAGNOSIS — G43909 Migraine, unspecified, not intractable, without status migrainosus: Secondary | ICD-10-CM | POA: Diagnosis not present

## 2018-01-27 DIAGNOSIS — Z8673 Personal history of transient ischemic attack (TIA), and cerebral infarction without residual deficits: Secondary | ICD-10-CM | POA: Diagnosis not present

## 2018-01-27 DIAGNOSIS — E785 Hyperlipidemia, unspecified: Secondary | ICD-10-CM | POA: Diagnosis not present

## 2018-02-07 DIAGNOSIS — H0100B Unspecified blepharitis left eye, upper and lower eyelids: Secondary | ICD-10-CM | POA: Diagnosis not present

## 2018-02-07 DIAGNOSIS — R51 Headache: Secondary | ICD-10-CM | POA: Diagnosis not present

## 2018-02-07 DIAGNOSIS — H0100A Unspecified blepharitis right eye, upper and lower eyelids: Secondary | ICD-10-CM | POA: Diagnosis not present

## 2018-02-07 DIAGNOSIS — H527 Unspecified disorder of refraction: Secondary | ICD-10-CM | POA: Diagnosis not present

## 2018-02-07 DIAGNOSIS — H26492 Other secondary cataract, left eye: Secondary | ICD-10-CM | POA: Diagnosis not present

## 2018-02-07 DIAGNOSIS — D23111 Other benign neoplasm of skin of right upper eyelid, including canthus: Secondary | ICD-10-CM | POA: Diagnosis not present

## 2018-02-07 DIAGNOSIS — H532 Diplopia: Secondary | ICD-10-CM | POA: Diagnosis not present

## 2018-02-07 DIAGNOSIS — Z961 Presence of intraocular lens: Secondary | ICD-10-CM | POA: Diagnosis not present

## 2018-02-07 DIAGNOSIS — H33302 Unspecified retinal break, left eye: Secondary | ICD-10-CM | POA: Diagnosis not present

## 2018-03-10 DIAGNOSIS — R197 Diarrhea, unspecified: Secondary | ICD-10-CM | POA: Diagnosis not present

## 2021-09-30 DEATH — deceased
